# Patient Record
Sex: Female | Born: 1985 | Hispanic: No | Marital: Married | Smoking: Never smoker
Health system: Southern US, Community
[De-identification: ages and names within clinical notes are randomized; demographics above are authoritative.]

## PROBLEM LIST (undated history)

## (undated) ENCOUNTER — Inpatient Hospital Stay (HOSPITAL_COMMUNITY): Payer: Self-pay

## (undated) DIAGNOSIS — Z800 Family history of malignant neoplasm of digestive organs: Secondary | ICD-10-CM

## (undated) DIAGNOSIS — Z8042 Family history of malignant neoplasm of prostate: Secondary | ICD-10-CM

## (undated) DIAGNOSIS — Z789 Other specified health status: Secondary | ICD-10-CM

## (undated) DIAGNOSIS — Z803 Family history of malignant neoplasm of breast: Secondary | ICD-10-CM

## (undated) DIAGNOSIS — D649 Anemia, unspecified: Secondary | ICD-10-CM

## (undated) HISTORY — DX: Family history of malignant neoplasm of breast: Z80.3

## (undated) HISTORY — DX: Anemia, unspecified: D64.9

## (undated) HISTORY — DX: Family history of malignant neoplasm of digestive organs: Z80.0

## (undated) HISTORY — PX: WISDOM TOOTH EXTRACTION: SHX21

## (undated) HISTORY — DX: Family history of malignant neoplasm of prostate: Z80.42

## (undated) HISTORY — PX: WRIST SURGERY: SHX841

---
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
## 2009-08-13 ENCOUNTER — Inpatient Hospital Stay (HOSPITAL_COMMUNITY): Admission: AD | Admit: 2009-08-13 | Discharge: 2009-08-16 | Payer: Self-pay | Admitting: Obstetrics and Gynecology

## 2010-11-10 ENCOUNTER — Emergency Department (HOSPITAL_COMMUNITY)
Admission: EM | Admit: 2010-11-10 | Discharge: 2010-11-10 | Payer: Self-pay | Source: Home / Self Care | Admitting: Family Medicine

## 2011-02-15 LAB — CBC
HCT: 33.7 % — ABNORMAL LOW (ref 36.0–46.0)
MCV: 82.1 fL (ref 78.0–100.0)
MCV: 83.2 fL (ref 78.0–100.0)
Platelets: 247 10*3/uL (ref 150–400)
RBC: 3.58 MIL/uL — ABNORMAL LOW (ref 3.87–5.11)
RBC: 4.1 MIL/uL (ref 3.87–5.11)
RDW: 13.7 % (ref 11.5–15.5)
RDW: 13.7 % (ref 11.5–15.5)
WBC: 12.2 10*3/uL — ABNORMAL HIGH (ref 4.0–10.5)

## 2012-01-07 ENCOUNTER — Emergency Department (INDEPENDENT_AMBULATORY_CARE_PROVIDER_SITE_OTHER)
Admission: EM | Admit: 2012-01-07 | Discharge: 2012-01-07 | Disposition: A | Discharge status: Home Health | Payer: Self-pay | Source: Home / Self Care | Attending: Emergency Medicine | Admitting: Emergency Medicine

## 2012-01-07 ENCOUNTER — Encounter (HOSPITAL_COMMUNITY): Payer: Self-pay

## 2012-01-07 DIAGNOSIS — B86 Scabies: Secondary | ICD-10-CM

## 2012-01-07 MED ORDER — PERMETHRIN 5 % EX CREA: TOPICAL_CREAM | CUTANEOUS | Status: AC

## 2012-01-07 MED ORDER — PREDNISONE 50 MG PO TABS: ORAL_TABLET | ORAL | Status: AC

## 2012-01-07 MED ORDER — LORATADINE 10 MG PO TABS: 10.0000 mg | ORAL_TABLET | Freq: Every day | ORAL | Status: DC

## 2012-01-07 NOTE — ED Provider Notes (Signed)
History     CSN: [id number]  Arrival date & time 01/07/12  1432   First MD Initiated Contact with Patient 01/07/12 1501      Chief Complaint  Patient presents with  . Rash    (Consider location/radiation/quality/duration/timing/severity/associated sxs/prior treatment) HPI Comments: Pt with progressively worsening  itchy rash on her arms, underneath her bra, torso, growing starting 5 days ago. States itching is worse at night. 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  No new lotions, soaps, detergents, medications. No exposure to poison ivy. Taking Benadryl with some relief.   Patient is a 26 y.o. female presenting with rash. The history is provided by the patient. No language interpreter was used.  Rash  This is a new problem. The current episode started more than 2 days ago. The problem has been gradually worsening. There has been no fever. The rash is present on the abdomen, back, left hand, right hand and groin. Associated symptoms include itching. Pertinent negatives include no blisters, no pain and no weeping. She has tried antihistamines for the symptoms. The treatment provided mild relief.    History reviewed. No pertinent past medical history.  Past Surgical History  Procedure Date  . Wrist surgery     History reviewed. No pertinent family history.  History  Substance Use Topics  . Smoking status: Never Smoker   . Smokeless tobacco: Not on file  . Alcohol Use: Yes    OB History    Grav Para Term Preterm Abortions TAB SAB Ect Mult Living                  Review of Systems  Constitutional: Negative for fever.  HENT: Negative.   Respiratory: Negative for shortness of breath.   Gastrointestinal: Negative for nausea, vomiting and abdominal pain.  Skin: Positive for itching and rash.    Allergies  Review of patient's allergies indicates no known allergies.  Home Medications   Current Outpatient Rx  Name Route Sig Dispense  Refill  . LORATADINE 10 MG PO TABS Oral Take 1 tablet (10 mg total) by mouth daily. 10 tablet 0  . PERMETHRIN 5 % EX CREA  Apply from chin down, leave on for 8-14 hours, rinse. Repeat in 1 week 60 g 0  . PREDNISONE 50 MG PO TABS  1 tablet po daily x 2 days, then 1/2 tablet once daily for 2 days 5 tablet 0    BP 108/73  Pulse 86  Temp(Src) 98.2 F (36.8 C) (Oral)  Resp 14  SpO2 98%  LMP 12/19/2011  Physical Exam  Nursing note and vitals reviewed. Constitutional: She is oriented to person, place, and time. She appears well-developed and well-nourished. No distress.  HENT:  Head: Normocephalic and atraumatic.  Eyes: Conjunctivae and EOM are normal.  Neck: Normal range of motion.  Cardiovascular: Normal rate.   Pulmonary/Chest: Effort normal.  Abdominal: She exhibits no distension.  Musculoskeletal: Normal range of motion.  Neurological: She is alert and oriented to person, place, and time.  Skin: Skin is warm and dry. Rash noted.       Scattered papular rash over torso, in between fingers, and in groin.  Psychiatric: She has a normal mood and affect. Her behavior is normal. Judgment and thought content normal.    ED Course  Procedures (including critical care time)  Labs Reviewed - No data to display No results found.   1. Scabies       MDM  Patient  also has extremely dry skin, however, has multiple contacts with scabies.   Luiz Blare, MD 01/07/12 [phone]

## 2012-01-07 NOTE — Discharge Instructions (Signed)
Curel, aveeno, neutrogena make good, heavy moisturizing creams. Take the medication as written. Return if you get worse or for any other concerns.   Scabies Scabies are small bugs (mites) that burrow under the skin and cause red bumps and severe itching. These bugs can only be seen with a microscope. Scabies are highly contagious. They can spread easily from person to person by direct contact. They are also spread through sharing clothing or linens that have the scabies mites living in them. It is not unusual for an entire family to become infected through shared towels, clothing, or bedding.  HOME CARE INSTRUCTIONS   Your caregiver may prescribe a cream or lotion to kill the mites. If this cream is prescribed; massage the cream into the entire area of the body from the neck to the bottom of both feet. Also massage the cream into the scalp and face if your child is less than 92 year old. Avoid the eyes and mouth.   Leave the cream on for 8 to12 hours. Do not wash your hands after application. Your child should bathe or shower after the 8 to 12 hour application period. Sometimes it is helpful to apply the cream to your child at right before bedtime.   One treatment is usually effective and will eliminate approximately 95% of infestations. For severe cases, your caregiver may decide to repeat the treatment in 1 week. Everyone in your household should be treated with one application of the cream.   New rashes or burrows should not appear after successful treatment within 24 to 48 hours; however the itching and rash may last for 2 to 4 weeks after successful treatment. If your symptoms persist longer than this, see your caregiver.   Your caregiver also may prescribe a medication to help with the itching or to help the rash go away more quickly.   Scabies can live on clothing or linens for up to 3 days. Your entire child's recently used clothing, towels, stuffed toys, and bed linens should be washed in hot  water and then dried in a dryer for at least 20 minutes on high heat. Items that cannot be washed should be enclosed in a plastic bag for at least 3 days.   To help relieve itching, bathe your child in a cool bath or apply cool washcloths to the affected areas.   Your child may return to school after treatment with the prescribed cream.  SEEK MEDICAL CARE IF:   The itching persists longer than 4 weeks after treatment.   The rash spreads or becomes infected (the area has red blisters or yellow-tan crust).  Document Released: 10/29/2005 Document Revised: 07/11/2011 Document Reviewed: 03/09/2009 [facility] Patient Information 2012 [place], [place].

## 2012-01-07 NOTE — ED Notes (Signed)
C/o rash with itching for 5 days.  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

## 2012-06-27 ENCOUNTER — Inpatient Hospital Stay (HOSPITAL_COMMUNITY)
Admission: AD | Admit: 2012-06-27 | Discharge: 2012-06-27 | Disposition: A | Discharge status: Home / Self Care | Payer: Medicaid Other | Source: Ambulatory Visit | Attending: Obstetrics and Gynecology | Admitting: Obstetrics and Gynecology

## 2012-06-27 ENCOUNTER — Inpatient Hospital Stay (HOSPITAL_COMMUNITY): Payer: Medicaid Other

## 2012-06-27 ENCOUNTER — Encounter (HOSPITAL_COMMUNITY): Payer: Self-pay | Admitting: *Deleted

## 2012-06-27 DIAGNOSIS — O209 Hemorrhage in early pregnancy, unspecified: Secondary | ICD-10-CM | POA: Insufficient documentation

## 2012-06-27 DIAGNOSIS — O26899 Other specified pregnancy related conditions, unspecified trimester: Secondary | ICD-10-CM

## 2012-06-27 DIAGNOSIS — R109 Unspecified abdominal pain: Secondary | ICD-10-CM | POA: Insufficient documentation

## 2012-06-27 HISTORY — DX: Other specified health status: Z78.9

## 2012-06-27 LAB — WET PREP, GENITAL: Yeast Wet Prep HPF POC: NONE SEEN

## 2012-06-27 LAB — URINALYSIS, ROUTINE W REFLEX MICROSCOPIC
Glucose, UA: NEGATIVE mg/dL
Protein, ur: NEGATIVE mg/dL
Specific Gravity, Urine: 1.01 (ref 1.005–1.030)
Urobilinogen, UA: 0.2 mg/dL (ref 0.0–1.0)
pH: 6 (ref 5.0–8.0)

## 2012-06-27 LAB — HCG, QUANTITATIVE, PREGNANCY: hCG, Beta Chain, Quant, S: 848 m[IU]/mL — ABNORMAL HIGH (ref <5)

## 2012-06-27 LAB — CBC
MCHC: 33.9 g/dL (ref 30.0–36.0)
MCV: 83 fL (ref 78.0–100.0)
Platelets: 257 10*3/uL (ref 150–400)
RBC: 4.3 MIL/uL (ref 3.87–5.11)
WBC: 8.2 10*3/uL (ref 4.0–10.5)

## 2012-06-27 LAB — POCT PREGNANCY, URINE: Preg Test, Ur: POSITIVE — AB

## 2012-06-27 NOTE — MAU Provider Note (Signed)
History   Sharon RACZKOWSKI is a 26 y.o. G2P1001 female at approximately 5wks 4days by uncertain LNMP of 05/19/12  presenting with report of small amount darker red intermittent vaginal bleeding that began last night.  Also intermittent 'contraction' pain in lower abdomen that began about a week ago, and lower back achiness x 2 weeks. Hasn't seen provider yet during the pregnancy.  +HPT x 3 this past Sunday, went to [facility] on Tues to confirm.  Last sexual intercourse this past Sunday.   CSN: [id number]  Arrival date and time: 06/27/12 1944   None     Chief Complaint  Patient presents with  . Back Pain  . Vaginal Bleeding  . Abdominal Pain   HPI  OB History    Grav Para Term Preterm Abortions TAB SAB Ect Mult Living   2 1 1       1       Past Medical History  Diagnosis Date  . No pertinent past medical history     Past Surgical History  Procedure Date  . Wrist surgery     Family History  Problem Relation Age of Onset  . Heart disease Paternal Aunt   . Heart disease Paternal Uncle   . Diabetes Maternal Grandmother   . Hypertension Maternal Grandmother   . Diabetes Maternal Grandfather   . Heart disease Maternal Grandfather   . Hypertension Maternal Grandfather   . Diabetes Paternal Grandmother   . Cancer Paternal Grandmother   . Diabetes Paternal Grandfather     History  Substance Use Topics  . Smoking status: Never Smoker   . Smokeless tobacco: Not on file  . Alcohol Use: Yes     rarely    Allergies: No Known Allergies  Prescriptions prior to admission  Medication Sig Dispense Refill  . Prenatal Vit-Fe Fumarate-FA (MULTIVITAMIN-PRENATAL) 27-0.8 MG TABS Take 1 tablet by mouth daily.        Review of Systems  Constitutional: Negative.  Negative for fever and chills.  Eyes: Negative.   Respiratory: Negative.   Cardiovascular: Negative.   Gastrointestinal: Positive for nausea and abdominal pain (lower abdominal 'contraction' type pain x 1 week). Negative for  vomiting, diarrhea and constipation.  Genitourinary: Negative.   Musculoskeletal: Positive for back pain (lower back achiness x 2 weeks).  Skin: Negative.   Neurological: Positive for headaches.  Endo/Heme/Allergies: Negative.   Psychiatric/Behavioral: Negative.    Physical Exam   Blood pressure 116/69, pulse 86, temperature 99.1 F (37.3 C), temperature source Oral, resp. rate 18, height 5\' 6"  (1.676 m), weight 85.9 kg (189 lb 6 oz), last menstrual period 05/19/2012.  Physical Exam  Constitutional: She is oriented to person, place, and time. She appears well-developed and well-nourished.  HENT:  Head: Normocephalic.  Eyes: Pupils are equal, round, and reactive to light.  Neck: Normal range of motion.  Cardiovascular: Normal rate and regular rhythm.   Respiratory: Effort normal and breath sounds normal.  GI: Soft. She exhibits no distension.  Genitourinary:       Mons piercing Spec exam: cervix visually closed, small amount white non-odorous mucousy d/c, no active bleeding or old blood in vault   Musculoskeletal: Normal range of motion.  Neurological: She is alert and oriented to person, place, and time. She has normal reflexes.  Skin: Skin is warm and dry.  Psychiatric: She has a normal mood and affect. Her behavior is normal. Judgment and thought content normal.    MAU Course  Procedures  MDM: CBC, ABO Rh,  Quant HCG <14wk comp ob u/s  Spec exam w/ GC/CT and wet prep obtained  Results for orders placed during the hospital encounter of 06/27/12 (from the past 24 hour(s))  URINALYSIS, ROUTINE W REFLEX MICROSCOPIC     Status: Normal   Collection Time   06/27/12  8:03 PM      Component Value Range   Color, Urine YELLOW  YELLOW   APPearance CLEAR  CLEAR   Specific Gravity, Urine 1.010  1.005 - 1.030   pH 6.0  5.0 - 8.0   Glucose, UA NEGATIVE  NEGATIVE mg/dL   Hgb urine dipstick NEGATIVE  NEGATIVE   Bilirubin Urine NEGATIVE  NEGATIVE   Ketones, ur NEGATIVE  NEGATIVE  mg/dL   Protein, ur NEGATIVE  NEGATIVE mg/dL   Urobilinogen, UA 0.2  0.0 - 1.0 mg/dL   Nitrite NEGATIVE  NEGATIVE   Leukocytes, UA NEGATIVE  NEGATIVE  POCT PREGNANCY, URINE     Status: Abnormal   Collection Time   06/27/12  8:27 PM      Component Value Range   Preg Test, Ur POSITIVE (*) NEGATIVE  CBC     Status: Abnormal   Collection Time   06/27/12  9:00 PM      Component Value Range   WBC 8.2  4.0 - 10.5 K/uL   RBC 4.30  3.87 - 5.11 MIL/uL   Hemoglobin 12.1  12.0 - 15.0 g/dL   HCT 40.9 (*) 81.1 - 91.4 %   MCV 83.0  78.0 - 100.0 fL   MCH 28.1  26.0 - 34.0 pg   MCHC 33.9  30.0 - 36.0 g/dL   RDW 78.2  95.6 - 21.3 %   Platelets 257  150 - 400 K/uL  ABO/RH     Status: Normal (Preliminary result)   Collection Time   06/27/12  9:00 PM      Component Value Range   ABO/RH(D) O POS    HCG, QUANTITATIVE, PREGNANCY     Status: Abnormal   Collection Time   06/27/12  9:00 PM      Component Value Range   hCG, Beta Chain, Quant, S 848 (*) <5 mIU/mL  WET PREP, GENITAL     Status: Abnormal   Collection Time   06/27/12  9:14 PM      Component Value Range   Yeast Wet Prep HPF POC NONE SEEN  NONE SEEN   Trich, Wet Prep NONE SEEN  NONE SEEN   Clue Cells Wet Prep HPF POC FEW (*) NONE SEEN   WBC, Wet Prep HPF POC FEW (*) NONE SEEN     Mar[place], Span  Home Medication Instructions [id number]   Printed on:06/27/12 2235  Medication Information                    Prenatal Vit-Fe Fumarate-FA (MULTIVITAMIN-PRENATAL) 27-0.8 MG TABS Take 1 tablet by mouth daily.            Follow-up Information    Follow up with [facility]. (ultrasound will call you with an appointment.  Return before then  if symptoms worsen)    Contact information:   [address] [place] [address] [phone]         Assessment and Plan  A:  5wks 4days gestation by today's u/s- IU gestational sac  Vaginal bleeding   Abdominal pain  P:  D/C home  Pelvic rest  Return for f/u u/s as  recommended by radiologist in 10-14d, u/s  to call pt for appt  Initiate prenatal care at preferred provider  Marge Duncans, CNM 06/27/2012, 8:57 PM

## 2012-06-27 NOTE — MAU Note (Signed)
Pt c/o achy pressure in her low back that has worsened over the past week. Denies any urinary symptoms.

## 2012-06-27 NOTE — MAU Note (Signed)
Pt states, " I started having bleeding off and on last night with low abdominal cramping. I've had burning pain in low back with aching for one week."

## 2012-06-28 LAB — GC/CHLAMYDIA PROBE AMP, GENITAL
Chlamydia, DNA Probe: NEGATIVE
GC Probe Amp, Genital: NEGATIVE

## 2012-06-28 LAB — ABO/RH: ABO/RH(D): O POS

## 2012-06-28 NOTE — MAU Provider Note (Signed)
Attestation of Attending Supervision of Advanced Practitioner: Evaluation and management procedures were performed by the PA/NP/CNM/OB Fellow under my supervision/collaboration. Chart reviewed and agree with management and plan.  Bilbo Carcamo V 06/28/2012 2:20 AM

## 2012-07-10 ENCOUNTER — Ambulatory Visit (HOSPITAL_COMMUNITY): Admission: RE | Admit: 2012-07-10 | Payer: Medicaid Other | Source: Ambulatory Visit

## 2012-07-21 ENCOUNTER — Ambulatory Visit (HOSPITAL_COMMUNITY)
Admission: RE | Admit: 2012-07-21 | Discharge: 2012-07-21 | Disposition: A | Discharge status: Home / Self Care | Payer: Medicaid Other | Source: Ambulatory Visit | Attending: Women's Health | Admitting: Women's Health

## 2012-07-21 DIAGNOSIS — O3680X0 Pregnancy with inconclusive fetal viability, not applicable or unspecified: Secondary | ICD-10-CM | POA: Insufficient documentation

## 2012-07-21 DIAGNOSIS — R109 Unspecified abdominal pain: Secondary | ICD-10-CM

## 2012-07-21 DIAGNOSIS — O26899 Other specified pregnancy related conditions, unspecified trimester: Secondary | ICD-10-CM

## 2012-07-21 DIAGNOSIS — Z3689 Encounter for other specified antenatal screening: Secondary | ICD-10-CM | POA: Insufficient documentation

## 2012-07-21 DIAGNOSIS — O209 Hemorrhage in early pregnancy, unspecified: Secondary | ICD-10-CM

## 2012-11-04 ENCOUNTER — Inpatient Hospital Stay (HOSPITAL_COMMUNITY)
Admission: AD | Admit: 2012-11-04 | Discharge: 2012-11-05 | Disposition: A | Discharge status: Home / Self Care | Payer: Medicaid Other | Source: Ambulatory Visit | Attending: Obstetrics & Gynecology | Admitting: Obstetrics & Gynecology

## 2012-11-04 ENCOUNTER — Encounter (HOSPITAL_COMMUNITY): Payer: Self-pay | Admitting: *Deleted

## 2012-11-04 DIAGNOSIS — R1011 Right upper quadrant pain: Secondary | ICD-10-CM | POA: Insufficient documentation

## 2012-11-04 DIAGNOSIS — K219 Gastro-esophageal reflux disease without esophagitis: Secondary | ICD-10-CM | POA: Insufficient documentation

## 2012-11-04 DIAGNOSIS — R1013 Epigastric pain: Secondary | ICD-10-CM

## 2012-11-04 DIAGNOSIS — K2970 Gastritis, unspecified, without bleeding: Secondary | ICD-10-CM | POA: Insufficient documentation

## 2012-11-04 DIAGNOSIS — R52 Pain, unspecified: Secondary | ICD-10-CM

## 2012-11-04 DIAGNOSIS — O99891 Other specified diseases and conditions complicating pregnancy: Secondary | ICD-10-CM | POA: Insufficient documentation

## 2012-11-04 LAB — CBC WITH DIFFERENTIAL/PLATELET
Basophils Relative: 0 % (ref 0–1)
Eosinophils Absolute: 0.1 10*3/uL (ref 0.0–0.7)
HCT: 34.3 % — ABNORMAL LOW (ref 36.0–46.0)
Hemoglobin: 11.6 g/dL — ABNORMAL LOW (ref 12.0–15.0)
MCH: 28.1 pg (ref 26.0–34.0)
MCHC: 33.8 g/dL (ref 30.0–36.0)
MCV: 83.1 fL (ref 78.0–100.0)
RBC: 4.13 MIL/uL (ref 3.87–5.11)
WBC: 8.4 10*3/uL (ref 4.0–10.5)

## 2012-11-04 LAB — URINALYSIS, ROUTINE W REFLEX MICROSCOPIC
Ketones, ur: >80 mg/dL — AB
Nitrite: NEGATIVE
Specific Gravity, Urine: 1.015 (ref 1.005–1.030)
Urobilinogen, UA: 0.2 mg/dL (ref 0.0–1.0)

## 2012-11-04 LAB — COMPREHENSIVE METABOLIC PANEL
Alkaline Phosphatase: 52 U/L (ref 39–117)
CO2: 22 mEq/L (ref 19–32)
Calcium: 8.9 mg/dL (ref 8.4–10.5)
Chloride: 101 mEq/L (ref 96–112)
Creatinine, Ser: 0.56 mg/dL (ref 0.50–1.10)
GFR calc non Af Amer: >90 mL/min (ref >90)
Glucose, Bld: 83 mg/dL (ref 70–99)
Potassium: 3.7 mEq/L (ref 3.5–5.1)
Total Bilirubin: 0.4 mg/dL (ref 0.3–1.2)

## 2012-11-04 LAB — URINE MICROSCOPIC-ADD ON

## 2012-11-04 MED ORDER — FAMOTIDINE 20 MG PO TABS: 20.0000 mg | ORAL_TABLET | Freq: Two times a day (BID) | ORAL | Status: DC | PRN

## 2012-11-04 MED ORDER — FAMOTIDINE 20 MG PO TABS
20.0000 mg | ORAL_TABLET | Freq: Once | ORAL | Status: AC
  Administered 2012-11-04: 20 mg via ORAL
  Filled 2012-11-04: qty 1

## 2012-11-04 MED ORDER — GI COCKTAIL ~~LOC~~
30.0000 mL | Freq: Once | ORAL | Status: AC
  Administered 2012-11-04: 30 mL via ORAL
  Filled 2012-11-04: qty 30

## 2012-11-04 NOTE — Progress Notes (Signed)
Pt states she is on bed rest for placenta acreta

## 2012-11-04 NOTE — MAU Provider Note (Signed)
History     CSN: [id number]  Arrival date and time: 11/04/12 2050   None     Chief Complaint  Patient presents with  . Abdominal Pain   HPI 26 y.o. G2P1001 at [redacted]w[redacted]d with upper abdominal pain that started 10 hours ago. Sharp pain, comes and goes. Lasts 20-30 seconds. Radiates down to umbilicus and to left shoulder.  Mild nausea, emesis x 1. No diarrhea. Had diarrhea this morning. No fever/chills. No bleeding, LOF. Baby moving well.   Sees Dr Excell Seltzer at [facility]. Last visit a week ago. Next visit 1/16. No complications with pregnancy or with last.  OB History    Grav Para Term Preterm Abortions TAB SAB Ect Mult Living   2 1 1       1       Past Medical History  Diagnosis Date  . No pertinent past medical history     Past Surgical History  Procedure Date  . Wrist surgery     Family History  Problem Relation Age of Onset  . Heart disease Paternal Aunt   . Heart disease Paternal Uncle   . Diabetes Maternal Grandmother   . Hypertension Maternal Grandmother   . Diabetes Maternal Grandfather   . Heart disease Maternal Grandfather   . Hypertension Maternal Grandfather   . Diabetes Paternal Grandmother   . Cancer Paternal Grandmother     History  Substance Use Topics  . Smoking status: Never Smoker   . Smokeless tobacco: Not on file  . Alcohol Use: Yes     Comment: rarely    Allergies: No Known Allergies  Prescriptions prior to admission  Medication Sig Dispense Refill  . Prenatal Vit-Fe Fumarate-FA (MULTIVITAMIN-PRENATAL) 27-0.8 MG TABS Take 1 tablet by mouth daily.        Review of Systems  Constitutional: Negative for fever and chills.  Eyes: Negative for blurred vision and double vision.  Gastrointestinal: Positive for nausea, vomiting, abdominal pain and diarrhea. Negative for heartburn and constipation.  Genitourinary: Negative for dysuria and urgency.  Musculoskeletal: Negative for back pain.  Neurological: Negative for dizziness and  headaches.   Physical Exam   Blood pressure 118/67, pulse 108, temperature 98.2 F (36.8 C), temperature source Oral, resp. rate 18, last menstrual period 05/19/2012.  Physical Exam  Constitutional: She is oriented to person, place, and time. She appears well-developed and well-nourished. No distress.  HENT:  Head: Normocephalic and atraumatic.  Eyes: Conjunctivae normal and EOM are normal.  Neck: Normal range of motion. Neck supple.  Cardiovascular: Normal rate, regular rhythm and normal heart sounds.   Respiratory: Effort normal and breath sounds normal. No respiratory distress.  GI: Soft. Bowel sounds are normal. There is Tenderness: RUQ and epigastric area, mild tenderness.. There is no rebound and no guarding.  Musculoskeletal: Normal range of motion. She exhibits no edema and no tenderness.  Neurological: She is alert and oriented to person, place, and time.  Skin: Skin is warm and dry.  Psychiatric: She has a normal mood and affect.   Results for orders placed during the hospital encounter of 11/04/12 (from the past 24 hour(s))  URINALYSIS, ROUTINE W REFLEX MICROSCOPIC     Status: Abnormal   Collection Time   11/04/12 10:30 PM      Component Value Range   Color, Urine YELLOW  YELLOW   APPearance CLEAR  CLEAR   Specific Gravity, Urine 1.015  1.005 - 1.030   pH 6.0  5.0 - 8.0   Glucose, UA NEGATIVE  NEGATIVE mg/dL   Hgb urine dipstick NEGATIVE  NEGATIVE   Bilirubin Urine NEGATIVE  NEGATIVE   Ketones, ur >80 (*) NEGATIVE mg/dL   Protein, ur NEGATIVE  NEGATIVE mg/dL   Urobilinogen, UA 0.2  0.0 - 1.0 mg/dL   Nitrite NEGATIVE  NEGATIVE   Leukocytes, UA SMALL (*) NEGATIVE  URINE MICROSCOPIC-ADD ON     Status: Normal   Collection Time   11/04/12 10:30 PM      Component Value Range   Squamous Epithelial / LPF RARE  RARE   WBC, UA 0-2  <3 WBC/hpf   RBC / HPF 0-2  <3 RBC/hpf  CBC WITH DIFFERENTIAL     Status: Abnormal   Collection Time   11/04/12 10:38 PM      Component  Value Range   WBC 8.4  4.0 - 10.5 K/uL   RBC 4.13  3.87 - 5.11 MIL/uL   Hemoglobin 11.6 (*) 12.0 - 15.0 g/dL   HCT 47.8 (*) 29.5 - 62.1 %   MCV 83.1  78.0 - 100.0 fL   MCH 28.1  26.0 - 34.0 pg   MCHC 33.8  30.0 - 36.0 g/dL   RDW 30.8  65.7 - 84.6 %   Platelets 195  150 - 400 K/uL   Neutrophils Relative 90 (*) 43 - 77 %   Neutro Abs 7.6  1.7 - 7.7 K/uL   Lymphocytes Relative 7 (*) 12 - 46 %   Lymphs Abs 0.6 (*) 0.7 - 4.0 K/uL   Monocytes Relative 3  3 - 12 %   Monocytes Absolute 0.2  0.1 - 1.0 K/uL   Eosinophils Relative 1  0 - 5 %   Eosinophils Absolute 0.1  0.0 - 0.7 K/uL   Basophils Relative 0  0 - 1 %   Basophils Absolute 0.0  0.0 - 0.1 K/uL  COMPREHENSIVE METABOLIC PANEL     Status: Abnormal   Collection Time   11/04/12 10:38 PM      Component Value Range   Sodium 134 (*) 135 - 145 mEq/L   Potassium 3.7  3.5 - 5.1 mEq/L   Chloride 101  96 - 112 mEq/L   CO2 22  19 - 32 mEq/L   Glucose, Bld 83  70 - 99 mg/dL   BUN 5 (*) 6 - 23 mg/dL   Creatinine, Ser 9.62  0.50 - 1.10 mg/dL   Calcium 8.9  8.4 - 95.2 mg/dL   Total Protein 6.4  6.0 - 8.3 g/dL   Albumin 2.9 (*) 3.5 - 5.2 g/dL   AST 18  0 - 37 U/L   ALT 10  0 - 35 U/L   Alkaline Phosphatase 52  39 - 117 U/L   Total Bilirubin 0.4  0.3 - 1.2 mg/dL   GFR calc non Af Amer >90  >90 mL/min   GFR calc Af Amer >90  >90 mL/min     FHTs:  155, moderate variability, accels present, no decels. TOCO:  No contractions   MAU Course  Procedures  GI Cocktail relieved pain - 10/10 --> 4/10. Also gave one dose pepcid  Assessment and Plan  26 y.o. G2P1001 at [redacted]w[redacted]d with epigastric/right upper quadrant pain.  - gastritis/GERD vs gallbladder vs colitis - Hemodynamically stable, FHTs reassuring, LFTs and CBC within normal limits - Home with pepcid, f/u with OB.  Napoleon Form 11/04/2012, 9:57 PM

## 2012-11-04 NOTE — Progress Notes (Signed)
Pt states she had a episode of vomiting and diarrhea x1   This morning

## 2012-11-04 NOTE — MAU Note (Signed)
Pt states she started  Having pain about 1200. Pt states she feels the pain more when she walks

## 2012-11-05 LAB — LIPASE, BLOOD: Lipase: 14 U/L (ref 11–59)

## 2012-11-06 NOTE — MAU Provider Note (Signed)
Attestation of Attending Supervision of Obstetric Fellow: Evaluation and management procedures were performed by the Obstetric Fellow under my supervision and collaboration.  I have reviewed the Obstetric Fellow's note and chart, and I agree with the management and plan.  Kumari Sculley, MD, FACOG Attending Obstetrician & Gynecologist Faculty Practice, [facility]   

## 2014-09-13 ENCOUNTER — Encounter (HOSPITAL_COMMUNITY): Payer: Self-pay | Admitting: *Deleted

## 2016-12-07 ENCOUNTER — Encounter (HOSPITAL_COMMUNITY): Payer: Self-pay | Admitting: Emergency Medicine

## 2016-12-07 ENCOUNTER — Ambulatory Visit (HOSPITAL_COMMUNITY)
Admission: EM | Admit: 2016-12-07 | Discharge: 2016-12-07 | Disposition: A | Discharge status: Home / Self Care | Payer: Medicaid Other | Attending: Family Medicine | Admitting: Family Medicine

## 2016-12-07 DIAGNOSIS — K0889 Other specified disorders of teeth and supporting structures: Secondary | ICD-10-CM | POA: Diagnosis not present

## 2016-12-07 DIAGNOSIS — J029 Acute pharyngitis, unspecified: Secondary | ICD-10-CM | POA: Insufficient documentation

## 2016-12-07 LAB — POCT INFECTIOUS MONO SCREEN: MONO SCREEN: NEGATIVE

## 2016-12-07 LAB — POCT RAPID STREP A: Streptococcus, Group A Screen (Direct): NEGATIVE

## 2016-12-07 MED ORDER — DICLOFENAC SODIUM 75 MG PO TBEC: 75.0000 mg | DELAYED_RELEASE_TABLET | Freq: Two times a day (BID) | ORAL | 0 refills | Status: DC

## 2016-12-07 MED ORDER — AMOXICILLIN 500 MG PO CAPS: 500.0000 mg | ORAL_CAPSULE | Freq: Three times a day (TID) | ORAL | 0 refills | Status: DC

## 2016-12-07 NOTE — Discharge Instructions (Signed)
You have been tested for Group A strep and for infectious mononucleosis. Both were negative. I have started you on an antibiotic for possible dental abscess and diclofenac for pain. Take Amoxacillin 3 times daily and the diclofenac twice daily. Should your symptoms fail to improve recommend following up with a dentist for further evaluation

## 2016-12-07 NOTE — ED Provider Notes (Signed)
CSN: [id number]     Arrival date & time 12/07/16  1012 History   First MD Initiated Contact with Patient 12/07/16 1107     Chief Complaint  Patient presents with  . Sore Throat   (Consider location/radiation/quality/duration/timing/severity/associated sxs/prior Treatment) 31 year old female presents with chief complaint of sore throat and swelling along the right side of her jaw. Swelling and pain started last night, it is painful to chew and painful to swallow. She reports URI like symptoms 1-2 weeks ago, reports those symptoms have cleared and she was feeling well prior to this.   The history is provided by the patient.  Sore Throat     Past Medical History:  Diagnosis Date  . No pertinent past medical history    Past Surgical History:  Procedure Laterality Date  . WRIST SURGERY     Family History  Problem Relation Age of Onset  . Heart disease Paternal Aunt   . Heart disease Paternal Uncle   . Diabetes Maternal Grandmother   . Hypertension Maternal Grandmother   . Diabetes Maternal Grandfather   . Heart disease Maternal Grandfather   . Hypertension Maternal Grandfather   . Diabetes Paternal Grandmother   . Cancer Paternal Grandmother    Social History  Substance Use Topics  . Smoking status: Never Smoker  . Smokeless tobacco: Never Used  . Alcohol use Yes     Comment: rarely   OB History    Gravida Para Term Preterm AB Living   2 1 1     1    SAB TAB Ectopic Multiple Live Births                 Review of Systems  Reason unable to perform ROS: as covered in HPI.  All other systems reviewed and are negative.   Allergies  Patient has no known allergies.  Home Medications   Prior to Admission medications   Medication Sig Start Date End Date Taking? Authorizing Provider  amoxicillin (AMOXIL) 500 MG capsule Take 1 capsule (500 mg total) by mouth 3 (three) times daily. 12/07/16   Barnet Glasgow, NP  diclofenac (VOLTAREN) 75 MG EC tablet Take 1 tablet (75 mg  total) by mouth 2 (two) times daily. 12/07/16   Barnet Glasgow, NP  famotidine (PEPCID) 20 MG tablet Take 1 tablet (20 mg total) by mouth 2 (two) times daily as needed for heartburn. 11/04/12   Martha Clan, MD  Prenatal Vit-Fe Fumarate-FA (MULTIVITAMIN-PRENATAL) 27-0.8 MG TABS Take 1 tablet by mouth daily.    Historical Provider, MD   Meds Ordered and Administered this Visit  Medications - No data to display  BP 117/83 (BP Location: Left Arm)   Pulse 111   Temp 98.5 F (36.9 C) (Oral)   Resp 18   LMP 12/07/2016   SpO2 100%   Breastfeeding? No  No data found.   Physical Exam  Constitutional: She is oriented to person, place, and time. She appears well-developed and well-nourished. No distress.  HENT:  Head: Normocephalic and atraumatic.    Right Ear: Tympanic membrane and external ear normal.  Left Ear: Tympanic membrane and external ear normal.  Nose: Nose normal.  Mouth/Throat: Uvula is midline and oropharynx is clear and moist. No oral lesions. Normal dentition. No dental abscesses or dental caries.  Neck: Normal range of motion. Neck supple. No JVD present.  Cardiovascular: Normal rate and regular rhythm.   Pulmonary/Chest: Effort normal.  Lymphadenopathy:       Head (right side):  Submandibular adenopathy present. No submental, no tonsillar and no preauricular adenopathy present.       Head (left side): No submental, no submandibular, no tonsillar and no preauricular adenopathy present.    She has no cervical adenopathy.  Neurological: She is alert and oriented to person, place, and time.  Skin: Skin is warm and dry. Capillary refill takes less than 2 seconds. She is not diaphoretic.  Psychiatric: She has a normal mood and affect.  Nursing note and vitals reviewed.   Urgent Care Course     Procedures (including critical care time)  Labs Review Labs Reviewed  CULTURE, GROUP A STREP [facility])  POCT RAPID STREP A  POCT INFECTIOUS MONO SCREEN    Imaging Review No  results found.   Visual Acuity Review  Right Eye Distance:   Left Eye Distance:   Bilateral Distance:    Right Eye Near:   Left Eye Near:    Bilateral Near:         MDM   1. Pain, dental     You have been tested for Group A strep and for infectious mononucleosis. Both were negative. I have started you on an antibiotic for possible dental abscess and diclofenac for pain. Take Amoxacillin 3 times daily and the diclofenac twice daily. Should your symptoms fail to improve recommend following up with a dentist for further evaluation      Barnet Glasgow, NP 12/07/16 1151

## 2016-12-07 NOTE — ED Triage Notes (Signed)
Here for ST onset last night associated w/submandibular swelling  Also states pain radiates to right ear   Denies fevers, chills  A&O x4... NAD

## 2016-12-09 LAB — CULTURE, GROUP A STREP (THRC)

## 2017-01-08 LAB — OB RESULTS CONSOLE GBS: STREP GROUP B AG: NEGATIVE

## 2017-04-15 ENCOUNTER — Emergency Department (HOSPITAL_COMMUNITY)
Admission: EM | Admit: 2017-04-15 | Discharge: 2017-04-15 | Disposition: A | Discharge status: Home / Self Care | Payer: Self-pay | Attending: Emergency Medicine | Admitting: Emergency Medicine

## 2017-04-15 ENCOUNTER — Encounter (HOSPITAL_COMMUNITY): Payer: Self-pay | Admitting: Emergency Medicine

## 2017-04-15 ENCOUNTER — Emergency Department (HOSPITAL_COMMUNITY): Payer: Self-pay

## 2017-04-15 DIAGNOSIS — Y929 Unspecified place or not applicable: Secondary | ICD-10-CM | POA: Insufficient documentation

## 2017-04-15 DIAGNOSIS — L03313 Cellulitis of chest wall: Secondary | ICD-10-CM | POA: Insufficient documentation

## 2017-04-15 DIAGNOSIS — S46911A Strain of unspecified muscle, fascia and tendon at shoulder and upper arm level, right arm, initial encounter: Secondary | ICD-10-CM | POA: Insufficient documentation

## 2017-04-15 DIAGNOSIS — Y939 Activity, unspecified: Secondary | ICD-10-CM | POA: Insufficient documentation

## 2017-04-15 DIAGNOSIS — X58XXXA Exposure to other specified factors, initial encounter: Secondary | ICD-10-CM | POA: Insufficient documentation

## 2017-04-15 DIAGNOSIS — Y999 Unspecified external cause status: Secondary | ICD-10-CM | POA: Insufficient documentation

## 2017-04-15 MED ORDER — OXYCODONE-ACETAMINOPHEN 5-325 MG PO TABS: 1.0000 | ORAL_TABLET | ORAL | 0 refills | Status: DC | PRN

## 2017-04-15 MED ORDER — MELOXICAM 15 MG PO TABS: 15.0000 mg | ORAL_TABLET | Freq: Every day | ORAL | 0 refills | Status: DC

## 2017-04-15 MED ORDER — IBUPROFEN 800 MG PO TABS
800.0000 mg | ORAL_TABLET | Freq: Once | ORAL | Status: AC
  Administered 2017-04-15: 800 mg via ORAL
  Filled 2017-04-15: qty 1

## 2017-04-15 MED ORDER — CEPHALEXIN 500 MG PO CAPS: 500.0000 mg | ORAL_CAPSULE | Freq: Four times a day (QID) | ORAL | 0 refills | Status: DC

## 2017-04-15 NOTE — ED Provider Notes (Signed)
[facility] DEPT Provider Note   CSN: [id number] Arrival date & time: 04/15/17  2002     History   Chief Complaint Chief Complaint  Patient presents with  . Shoulder Pain    HPI Sharon Thornton is a 31 y.o. female who presents emergency Department with chief complaint of left breast pain and right shoulder pain. Patient states that she was stung on the left breast by a bee earlier today. She states she has moderate tenderness to palpation and has noticed some spreading erythema from the site of the initial sting. She does not think that the stinger is still present. The patient also complains of pain in her right shoulder, which she rates as severe. The patient states that she went through a labor and delivery without an epidural twice and feels like this is much worse. She was out, weak appearing today with friends on the leg when she fell. She was dragged behind on the leg, holding onto the handlebars. She had some initial pain in the shoulder, however, she states that throughout the day it has become excessively worse and now severe. She has associated numbness and tingling in the right hand, especially in the distribution of the ulnar nerve. She denies neck pain, head trauma. She denies inability to move the arm. She has no previous injuries. HPI  Past Medical History:  Diagnosis Date  . No pertinent past medical history     There are no active problems to display for this patient.   Past Surgical History:  Procedure Laterality Date  . WRIST SURGERY      OB History    Gravida Para Term Preterm AB Living   2 1 1     1    SAB TAB Ectopic Multiple Live Births                   Home Medications    Prior to Admission medications   Medication Sig Start Date End Date Taking? Authorizing Provider  amoxicillin (AMOXIL) 500 MG capsule Take 1 capsule (500 mg total) by mouth 3 (three) times daily. 12/07/16   Barnet Glasgow, NP  diclofenac (VOLTAREN) 75 MG EC tablet Take 1 tablet  (75 mg total) by mouth 2 (two) times daily. 12/07/16   Barnet Glasgow, NP  famotidine (PEPCID) 20 MG tablet Take 1 tablet (20 mg total) by mouth 2 (two) times daily as needed for heartburn. 11/04/12   Martha Clan, MD  Prenatal Vit-Fe Fumarate-FA (MULTIVITAMIN-PRENATAL) 27-0.8 MG TABS Take 1 tablet by mouth daily.    [provider]    Family History Family History  Problem Relation Age of Onset  . Heart disease Paternal Aunt   . Heart disease Paternal Uncle   . Diabetes Maternal Grandmother   . Hypertension Maternal Grandmother   . Diabetes Maternal Grandfather   . Heart disease Maternal Grandfather   . Hypertension Maternal Grandfather   . Diabetes Paternal Grandmother   . Cancer Paternal Grandmother     Social History Social History  Substance Use Topics  . Smoking status: Never Smoker  . Smokeless tobacco: Never Used  . Alcohol use Yes     Comment: rarely     Allergies   Patient has no known allergies.   Review of Systems Review of Systems  Ten systems reviewed and are negative for acute change, except as noted in the HPI.   Physical Exam Updated Vital Signs BP (!) 110/93   Pulse 91   Temp 97.9 F (36.6  C)   Resp 18   Ht 5\' 7"  (1.702 m)   Wt 99.8 kg (220 lb)   LMP 03/31/2017   SpO2 100%   BMI 34.46 kg/m   Physical Exam  Constitutional: She is oriented to person, place, and time. She appears well-developed and well-nourished. No distress.  HENT:  Head: Normocephalic and atraumatic.  Eyes: Conjunctivae and EOM are normal. Pupils are equal, round, and reactive to light. No scleral icterus.  Neck: Normal range of motion.  Cardiovascular: Normal rate, regular rhythm and normal heart sounds.  Exam reveals no gallop and no friction rub.   No murmur heard. Pulmonary/Chest: Effort normal and breath sounds normal. No respiratory distress.  Left breast with a small area consistent with site of the initial sting from the bee. There is about 12-15 cm of  extending erythema, moderate tenderness. No step-off palpation, warmth present.  Abdominal: Soft. Bowel sounds are normal. She exhibits no distension and no mass. There is no tenderness. There is no guarding.  Musculoskeletal:  Right shoulder is not tender to palpation. She is able to abduct the arm slowly but complains of significant pain in the axilla. She is exquisitely tender to palpation there. She has normal bilateral grip strength. Range of motion is reduced secondary to pain.  Neurological: She is alert and oriented to person, place, and time.  Skin: Skin is warm and dry. She is not diaphoretic.  Psychiatric: Her behavior is normal.  Nursing note and vitals reviewed.    ED Treatments / Results  Labs (all labs ordered are listed, but only abnormal results are displayed) Labs Reviewed - No data to display  EKG  EKG Interpretation None       Radiology Dg Shoulder Right  Result Date: 04/15/2017 CLINICAL DATA:  Right shoulder pain-worse under arm onset today after wake boarding. Initial encounter. EXAM: RIGHT SHOULDER - 2+ VIEW COMPARISON:  None. FINDINGS: There is no evidence of fracture or dislocation. There is no evidence of arthropathy or other focal bone abnormality. Soft tissues are unremarkable. IMPRESSION: No acute osseous injury of the right shoulder. Electronically Signed   By: Kathreen Devoid   On: 04/15/2017 20:53    Procedures Procedures (including critical care time)  Medications Ordered in ED Medications - No data to display   Initial Impression / Assessment and Plan / ED Course  I have reviewed the triage vital signs and the nursing notes.  Pertinent labs & imaging results that were available during my care of the patient were reviewed by me and considered in my medical decision making (see chart for details).    patient with negative x-ray of the shoulder seen in shared visit with Dr. Lita Mains. Left breast appears consistent with developing cellulitis and  she'll be treated with Keflex. Her right shoulder injury is most consistent with a brachial plexus and/or a tear of the musculature on the chest wall in the serratus anterior. Given her neurologic symptoms, I feel this is most likely a brachial plexus injury. The patient feels improved in a sling with support of the arm. She will be given narcotic pain medication given the severity of her symptoms and follow up with orthopedics. Patient expresses understanding and agrees with plan of care. We have discussed return precautions. She appears safe for discharge at this time  Final Clinical Impressions(s) / ED Diagnoses   Final diagnoses:  None    New Prescriptions New Prescriptions   No medications on file     Margarita Mail, PA-C  04/16/17 1613    Julianne Rice, MD 04/18/17 1505

## 2017-04-15 NOTE — Discharge Instructions (Signed)
Contact a health care provider if: Your symptoms do not improve within 1 week. Your symptoms get worse. You have symptoms in both arms. Get help right away if: You develop severe neck pain. You lose control of your urine or stool. You develop new or increased weakness in your arms or legs.

## 2017-04-15 NOTE — ED Triage Notes (Signed)
Pt states she got stung by bee on her left breast earlier today and c/o right shoulder pain after wake boarding earlier. Left breast is swollen, red and warm to touch.

## 2017-06-20 LAB — OB RESULTS CONSOLE GC/CHLAMYDIA
CHLAMYDIA, DNA PROBE: NEGATIVE
GC PROBE AMP, GENITAL: NEGATIVE

## 2017-06-20 LAB — OB RESULTS CONSOLE ANTIBODY SCREEN: ANTIBODY SCREEN: NEGATIVE

## 2017-06-20 LAB — OB RESULTS CONSOLE HEPATITIS B SURFACE ANTIGEN: Hepatitis B Surface Ag: NEGATIVE

## 2017-06-20 LAB — OB RESULTS CONSOLE ABO/RH: RH Type: POSITIVE

## 2017-06-20 LAB — OB RESULTS CONSOLE HIV ANTIBODY (ROUTINE TESTING): HIV: NONREACTIVE

## 2017-06-20 LAB — OB RESULTS CONSOLE RPR: RPR: NONREACTIVE

## 2017-06-20 LAB — OB RESULTS CONSOLE RUBELLA ANTIBODY, IGM: RUBELLA: NON-IMMUNE/NOT IMMUNE

## 2017-11-12 NOTE — L&D Delivery Note (Signed)
Vaginal Delivery Note - shoulder dystocia  Patient pushed for less than 10 minutes after she was noted to be C/C/+2.  At 1:18 PM a viable and healthy female was delivered via .  Presentation: vertex; Position: Right,, Occiput,, Anterior; Station: +2. After the delivery of the head, the anterior shoulder did not immediately deliver with gentle downward traction and a shoulder dystocia was performed.  Simultaneous McRoberts and Suprapubic pressure was applied by the assisting nursing staff and the anterior shoulder delivered followed by the rest of the body easily.  The baby was placed on the maternal abdomen.  The baby had a vigorous cry and was moving all four extremities well. The cord was double clamped and cut. Cord blood obtained. The placenta spontaneously delivered intact 3 vessels noted.  Uterine atony was alleviated by massage and pitocin.  There were no noted lacerations.   Delivery of the head: 02/07/2018  1:18 PM First maneuver: 02/07/2018  1:18 PM, McRoberts Second maneuver: 02/07/2018  1:18 PM, Suprapubic Pressure   Verbal consent: obtained from patient.  APGAR: 9, 10; weight pending  Anesthesia:  Epidural Episiotomy: None Lacerations: None Suture Repair: n/a Est. Blood Loss (mL): 400  Mom to postpartum.  Baby to Couplet care / Skin to Skin.  Merril Isakson, [place] 02/07/2018, 1:43 PM

## 2017-11-13 LAB — OB RESULTS CONSOLE HIV ANTIBODY (ROUTINE TESTING): HIV: NONREACTIVE

## 2017-11-13 LAB — OB RESULTS CONSOLE RPR: RPR: NONREACTIVE

## 2018-01-08 LAB — OB RESULTS CONSOLE GBS: GBS: NEGATIVE

## 2018-01-31 ENCOUNTER — Telehealth (HOSPITAL_COMMUNITY): Payer: Self-pay | Admitting: *Deleted

## 2018-01-31 ENCOUNTER — Encounter (HOSPITAL_COMMUNITY): Payer: Self-pay | Admitting: *Deleted

## 2018-01-31 NOTE — Telephone Encounter (Signed)
Preadmission screen  

## 2018-02-06 ENCOUNTER — Other Ambulatory Visit: Payer: Self-pay | Admitting: Obstetrics & Gynecology

## 2018-02-07 ENCOUNTER — Inpatient Hospital Stay (HOSPITAL_COMMUNITY): Payer: Medicaid Other | Admitting: Anesthesiology

## 2018-02-07 ENCOUNTER — Inpatient Hospital Stay (HOSPITAL_COMMUNITY)
Admission: RE | Admit: 2018-02-07 | Discharge: 2018-02-08 | DRG: 807 | Disposition: A | Discharge status: Home / Self Care | Payer: Medicaid Other | Source: Ambulatory Visit | Attending: Obstetrics & Gynecology | Admitting: Obstetrics & Gynecology

## 2018-02-07 ENCOUNTER — Inpatient Hospital Stay (HOSPITAL_COMMUNITY)
Admission: AD | Admit: 2018-02-07 | Payer: Medicaid Other | Source: Ambulatory Visit | Admitting: Obstetrics and Gynecology

## 2018-02-07 ENCOUNTER — Encounter (HOSPITAL_COMMUNITY): Payer: Self-pay

## 2018-02-07 DIAGNOSIS — Z3A40 40 weeks gestation of pregnancy: Secondary | ICD-10-CM | POA: Diagnosis not present

## 2018-02-07 DIAGNOSIS — Z3483 Encounter for supervision of other normal pregnancy, third trimester: Secondary | ICD-10-CM | POA: Diagnosis present

## 2018-02-07 DIAGNOSIS — O99214 Obesity complicating childbirth: Secondary | ICD-10-CM | POA: Diagnosis present

## 2018-02-07 DIAGNOSIS — O99213 Obesity complicating pregnancy, third trimester: Secondary | ICD-10-CM | POA: Diagnosis present

## 2018-02-07 LAB — CBC
HCT: 33.4 % — ABNORMAL LOW (ref 36.0–46.0)
Hemoglobin: 10.9 g/dL — ABNORMAL LOW (ref 12.0–15.0)
MCH: 24.4 pg — AB (ref 26.0–34.0)
MCHC: 32.6 g/dL (ref 30.0–36.0)
MCV: 74.7 fL — AB (ref 78.0–100.0)
PLATELETS: 396 10*3/uL (ref 150–400)
RBC: 4.47 MIL/uL (ref 3.87–5.11)
RDW: 15.8 % — AB (ref 11.5–15.5)
WBC: 10.9 10*3/uL — ABNORMAL HIGH (ref 4.0–10.5)

## 2018-02-07 LAB — TYPE AND SCREEN
ABO/RH(D): O POS
Antibody Screen: NEGATIVE

## 2018-02-07 LAB — RPR: RPR Ser Ql: NONREACTIVE

## 2018-02-07 MED ORDER — METHYLERGONOVINE MALEATE 0.2 MG PO TABS: 0.2000 mg | ORAL_TABLET | ORAL | Status: DC | PRN

## 2018-02-07 MED ORDER — ACETAMINOPHEN 325 MG PO TABS
650.0000 mg | ORAL_TABLET | ORAL | Status: DC | PRN
  Administered 2018-02-08: 650 mg via ORAL
  Filled 2018-02-07: qty 2

## 2018-02-07 MED ORDER — SOD CITRATE-CITRIC ACID 500-334 MG/5ML PO SOLN: 30.0000 mL | ORAL | Status: DC | PRN

## 2018-02-07 MED ORDER — ONDANSETRON HCL 4 MG/2ML IJ SOLN: 4.0000 mg | Freq: Four times a day (QID) | INTRAMUSCULAR | Status: DC | PRN

## 2018-02-07 MED ORDER — ONDANSETRON HCL 4 MG PO TABS: 4.0000 mg | ORAL_TABLET | ORAL | Status: DC | PRN

## 2018-02-07 MED ORDER — SENNOSIDES-DOCUSATE SODIUM 8.6-50 MG PO TABS
2.0000 | ORAL_TABLET | ORAL | Status: DC
  Administered 2018-02-08: 2 via ORAL
  Filled 2018-02-07: qty 2

## 2018-02-07 MED ORDER — OXYTOCIN 40 UNITS IN LACTATED RINGERS INFUSION - SIMPLE MED: 2.5000 [IU]/h | INTRAVENOUS | Status: DC | PRN

## 2018-02-07 MED ORDER — OXYTOCIN BOLUS FROM INFUSION
500.0000 mL | Freq: Once | INTRAVENOUS | Status: AC
  Administered 2018-02-07: 500 mL via INTRAVENOUS

## 2018-02-07 MED ORDER — PRENATAL MULTIVITAMIN CH
1.0000 | ORAL_TABLET | Freq: Every day | ORAL | Status: DC
  Administered 2018-02-08: 1 via ORAL
  Filled 2018-02-07: qty 1

## 2018-02-07 MED ORDER — TETANUS-DIPHTH-ACELL PERTUSSIS 5-2.5-18.5 LF-MCG/0.5 IM SUSP: 0.5000 mL | Freq: Once | INTRAMUSCULAR | Status: DC

## 2018-02-07 MED ORDER — MISOPROSTOL 25 MCG QUARTER TABLET
25.0000 ug | ORAL_TABLET | ORAL | Status: DC | PRN
  Filled 2018-02-07: qty 1

## 2018-02-07 MED ORDER — EPHEDRINE 5 MG/ML INJ
10.0000 mg | INTRAVENOUS | Status: DC | PRN
  Filled 2018-02-07: qty 2

## 2018-02-07 MED ORDER — SIMETHICONE 80 MG PO CHEW: 80.0000 mg | CHEWABLE_TABLET | ORAL | Status: DC | PRN

## 2018-02-07 MED ORDER — METHYLERGONOVINE MALEATE 0.2 MG/ML IJ SOLN: 0.2000 mg | INTRAMUSCULAR | Status: DC | PRN

## 2018-02-07 MED ORDER — PHENYLEPHRINE 40 MCG/ML (10ML) SYRINGE FOR IV PUSH (FOR BLOOD PRESSURE SUPPORT)
80.0000 ug | PREFILLED_SYRINGE | INTRAVENOUS | Status: DC | PRN
  Filled 2018-02-07: qty 5

## 2018-02-07 MED ORDER — DIPHENHYDRAMINE HCL 25 MG PO CAPS: 25.0000 mg | ORAL_CAPSULE | Freq: Four times a day (QID) | ORAL | Status: DC | PRN

## 2018-02-07 MED ORDER — ZOLPIDEM TARTRATE 5 MG PO TABS: 5.0000 mg | ORAL_TABLET | Freq: Every evening | ORAL | Status: DC | PRN

## 2018-02-07 MED ORDER — FLEET ENEMA 7-19 GM/118ML RE ENEM: 1.0000 | ENEMA | RECTAL | Status: DC | PRN

## 2018-02-07 MED ORDER — PHENYLEPHRINE 40 MCG/ML (10ML) SYRINGE FOR IV PUSH (FOR BLOOD PRESSURE SUPPORT)
80.0000 ug | PREFILLED_SYRINGE | INTRAVENOUS | Status: DC | PRN
  Filled 2018-02-07: qty 10
  Filled 2018-02-07: qty 5

## 2018-02-07 MED ORDER — LIDOCAINE HCL (PF) 1 % IJ SOLN
INTRAMUSCULAR | Status: DC | PRN
  Administered 2018-02-07 (×2): 5 mL

## 2018-02-07 MED ORDER — ONDANSETRON HCL 4 MG/2ML IJ SOLN: 4.0000 mg | INTRAMUSCULAR | Status: DC | PRN

## 2018-02-07 MED ORDER — BENZOCAINE-MENTHOL 20-0.5 % EX AERO
1.0000 "application " | INHALATION_SPRAY | CUTANEOUS | Status: DC | PRN
  Administered 2018-02-08: 1 via TOPICAL
  Filled 2018-02-07: qty 56

## 2018-02-07 MED ORDER — IBUPROFEN 600 MG PO TABS
600.0000 mg | ORAL_TABLET | Freq: Four times a day (QID) | ORAL | Status: DC
Start: 2018-02-07 — End: 2018-02-08
  Administered 2018-02-07 – 2018-02-08 (×4): 600 mg via ORAL
  Filled 2018-02-07 (×5): qty 1

## 2018-02-07 MED ORDER — OXYTOCIN 40 UNITS IN LACTATED RINGERS INFUSION - SIMPLE MED: 1.0000 m[IU]/min | INTRAVENOUS | Status: DC

## 2018-02-07 MED ORDER — MEASLES, MUMPS & RUBELLA VAC ~~LOC~~ INJ
0.5000 mL | INJECTION | Freq: Once | SUBCUTANEOUS | Status: DC
  Filled 2018-02-07: qty 0.5

## 2018-02-07 MED ORDER — LIDOCAINE HCL (PF) 1 % IJ SOLN
30.0000 mL | INTRAMUSCULAR | Status: DC | PRN
  Filled 2018-02-07: qty 30

## 2018-02-07 MED ORDER — TERBUTALINE SULFATE 1 MG/ML IJ SOLN
0.2500 mg | Freq: Once | INTRAMUSCULAR | Status: DC | PRN
  Filled 2018-02-07: qty 1

## 2018-02-07 MED ORDER — ACETAMINOPHEN 325 MG PO TABS: 650.0000 mg | ORAL_TABLET | ORAL | Status: DC | PRN

## 2018-02-07 MED ORDER — OXYCODONE HCL 5 MG PO TABS
5.0000 mg | ORAL_TABLET | ORAL | Status: DC | PRN
  Administered 2018-02-07: 5 mg via ORAL
  Filled 2018-02-07: qty 1

## 2018-02-07 MED ORDER — DIPHENHYDRAMINE HCL 50 MG/ML IJ SOLN: 12.5000 mg | INTRAMUSCULAR | Status: DC | PRN

## 2018-02-07 MED ORDER — FENTANYL 2.5 MCG/ML BUPIVACAINE 1/10 % EPIDURAL INFUSION (WH - ANES)
14.0000 mL/h | INTRAMUSCULAR | Status: DC | PRN
  Administered 2018-02-07: 14 mL/h via EPIDURAL
  Filled 2018-02-07: qty 100

## 2018-02-07 MED ORDER — FENTANYL CITRATE (PF) 100 MCG/2ML IJ SOLN: 50.0000 ug | INTRAMUSCULAR | Status: DC | PRN

## 2018-02-07 MED ORDER — LACTATED RINGERS IV SOLN
500.0000 mL | Freq: Once | INTRAVENOUS | Status: AC
  Administered 2018-02-07: 500 mL via INTRAVENOUS

## 2018-02-07 MED ORDER — COCONUT OIL OIL: 1.0000 "application " | TOPICAL_OIL | Status: DC | PRN

## 2018-02-07 MED ORDER — DIBUCAINE 1 % RE OINT: 1.0000 "application " | TOPICAL_OINTMENT | RECTAL | Status: DC | PRN

## 2018-02-07 MED ORDER — LACTATED RINGERS IV SOLN
INTRAVENOUS | Status: DC
  Administered 2018-02-07: 08:00:00 via INTRAVENOUS

## 2018-02-07 MED ORDER — WITCH HAZEL-GLYCERIN EX PADS: 1.0000 "application " | MEDICATED_PAD | CUTANEOUS | Status: DC | PRN

## 2018-02-07 MED ORDER — OXYCODONE HCL 5 MG PO TABS: 10.0000 mg | ORAL_TABLET | ORAL | Status: DC | PRN

## 2018-02-07 MED ORDER — OXYTOCIN 40 UNITS IN LACTATED RINGERS INFUSION - SIMPLE MED
2.5000 [IU]/h | INTRAVENOUS | Status: DC
  Administered 2018-02-07: 2.5 [IU]/h via INTRAVENOUS
  Filled 2018-02-07: qty 1000

## 2018-02-07 MED ORDER — LACTATED RINGERS IV SOLN: 500.0000 mL | INTRAVENOUS | Status: DC | PRN

## 2018-02-07 NOTE — Plan of Care (Signed)
Patient comfortable with an epidural. No questions at this time.

## 2018-02-07 NOTE — Anesthesia Preprocedure Evaluation (Signed)
Anesthesia Evaluation  Patient identified by MRN, date of birth, ID band Patient awake    Reviewed: Allergy & Precautions, H&P , NPO status , Patient's Chart, lab work & pertinent test results  History of Anesthesia Complications Negative for: history of anesthetic complications  Airway Mallampati: II  TM Distance: >3 FB Neck ROM: full    Dental no notable dental hx. (+) Teeth Intact   Pulmonary neg pulmonary ROS,    Pulmonary exam normal breath sounds clear to auscultation       Cardiovascular negative cardio ROS Normal cardiovascular exam Rhythm:regular Rate:Normal     Neuro/Psych negative neurological ROS  negative psych ROS   GI/Hepatic negative GI ROS, Neg liver ROS,   Endo/Other  Morbid obesity  Renal/GU negative Renal ROS  negative genitourinary   Musculoskeletal   Abdominal   Peds  Hematology negative hematology ROS (+)   Anesthesia Other Findings   Reproductive/Obstetrics (+) Pregnancy                             Anesthesia Physical Anesthesia Plan  ASA: III  Anesthesia Plan: Epidural   Post-op Pain Management:    Induction:   PONV Risk Score and Plan:   Airway Management Planned:   Additional Equipment:   Intra-op Plan:   Post-operative Plan:   Informed Consent: I have reviewed the patients History and Physical, chart, labs and discussed the procedure including the risks, benefits and alternatives for the proposed anesthesia with the patient or authorized representative who has indicated his/her understanding and acceptance.       Plan Discussed with:   Anesthesia Plan Comments:         Anesthesia Quick Evaluation  

## 2018-02-07 NOTE — Progress Notes (Signed)
Sharon Thornton is a 32 y.o. G3P2001 at [redacted]w[redacted]d by LMP admitted for active labor  Subjective: Patient comfortable with epidural   Objective: BP 129/72   Pulse 88   Temp 98 F (36.7 C) (Oral)   Resp 16   Ht 5\' 7"  (1.702 m)   Wt 126.3 kg (278 lb 8 oz)   LMP 05/17/2017   SpO2 100%   BMI 43.62 kg/m  No intake/output data recorded. No intake/output data recorded.  FHT:  FHR: 140 bpm, variability: moderate,  accelerations:  Present,  decelerations:  Absent UC:   regular, every 2--3 minutes SVE:   Dilation: 9 Effacement (%): 80 Station: -1 Exam by:: Izza Bickle, MD AROM: light mec  Labs: Lab Results  Component Value Date   WBC 10.9 (H) 02/07/2018   HGB 10.9 (L) 02/07/2018   HCT 33.4 (L) 02/07/2018   MCV 74.7 (L) 02/07/2018   PLT 396 02/07/2018    Assessment / Plan: Spontaneous labor, progressing normally  Labor: Progressing normally Preeclampsia:  no signs or symptoms of toxicity Fetal Wellbeing:  Category I Pain Control:  Epidural I/D:  n/a Anticipated MOD:  NSVD  Tonee Silverstein STACIA 02/07/2018, 11:41 AM

## 2018-02-07 NOTE — H&P (Signed)
Sharon Thornton is a 32 y.o. female G3P2002 at 40 weeks presenting for painful regular contractions. Patient denies LOF, reports bloody show and notes normal fetal movement.  She was scheduled already for IOL today.   OB History    Gravida  3   Para  2   Term  2   Preterm      AB      Living  1     SAB      TAB      Ectopic      Multiple      Live Births             Past Medical History:  Diagnosis Date  . Anemia   . No pertinent past medical history    Past Surgical History:  Procedure Laterality Date  . WISDOM TOOTH EXTRACTION    . WRIST SURGERY     Family History: family history includes Cancer in her paternal grandmother; Diabetes in her maternal grandfather, maternal grandmother, and paternal grandmother; Heart disease in her maternal grandfather, paternal aunt, and paternal uncle; Hypertension in her maternal grandfather and maternal grandmother. Social History:  reports that she has never smoked. She has never used smokeless tobacco. She reports that she drinks alcohol. She reports that she does not use drugs.     Maternal Diabetes: No Genetic Screening: Declined Maternal Ultrasounds/Referrals: Normal Fetal Ultrasounds or other Referrals:  None Maternal Substance Abuse:  No Significant Maternal Medications:  None Significant Maternal Lab Results:  Lab values include: Group B Strep negative Other Comments:  None  ROS History On admission : Dilation: 6cm  Blood pressure 129/72, pulse 88, temperature 98 F (36.7 C), temperature source Oral, resp. rate 16, height 5\' 7"  (1.702 m), weight 126.3 kg (278 lb 8 oz), last menstrual period 05/17/2017, SpO2 100 %. Exam Physical Exam  Prenatal labs: ABO, Rh: --/--/O POS (03/29 0802) Antibody: NEG (03/29 0802) Rubella: Nonimmune (08/09 0000) RPR: Nonreactive (01/02 0000)  HBsAg: Negative (08/09 0000)  HIV: Non-reactive (01/02 0000)  GBS: Negative (02/27 0000)   Assessment/Plan: 32 yo G3P2002 SIUP at 40  weeks in active labor Admit to Labor and Delivery  Continuous monitoring Epidural  Anticipate NSVD   Herve Haug, [place] 02/07/2018, 11:34 AM

## 2018-02-07 NOTE — Progress Notes (Addendum)
2031: Pt C/O abdominal cramping 5/10 but feels like it is progressively getting worst. Oxi 5 mg po prn administered. We will reassess in 1 hr.  2131: Pain medication was effective, score 1/10

## 2018-02-07 NOTE — Plan of Care (Addendum)
Pt is voiding satisfactorily, no urinary retention noted or reported. Pt is also ambulating in her room and demonstrates positive interaction with her baby.

## 2018-02-07 NOTE — Progress Notes (Signed)
RN spoke with Nancy,CNM about putting induction on hold; CNM agreed that pt may be on hold; K.Yasuko Lapage,RNC called and left a message on pt's phone and father's phone notifying them of induction placed on hold and to call labor and delivery when they get the message.

## 2018-02-07 NOTE — Anesthesia Postprocedure Evaluation (Signed)
Anesthesia Post Note  Patient: Sharon Thornton  Procedure(s) Performed: AN AD [place]     Patient location during evaluation: Mother Baby Anesthesia Type: Epidural Level of consciousness: awake Pain management: satisfactory to patient Vital Signs Assessment: post-procedure vital signs reviewed and stable Respiratory status: spontaneous breathing Cardiovascular status: stable Anesthetic complications: no    Last Vitals:  Vitals:   02/07/18 1510 02/07/18 1607  BP: 140/69 (!) 126/58  Pulse: 79 68  Resp: 18 18  Temp: 37.2 C 37.1 C  SpO2:      Last Pain:  Vitals:   02/07/18 1607  TempSrc: Oral  PainSc: 5    Pain Goal: Patients Stated Pain Goal: 4 (02/07/18 1330)               Casimer Lanius

## 2018-02-07 NOTE — Anesthesia Procedure Notes (Signed)
Epidural Patient location during procedure: OB  Staffing Anesthesiologist: Velvet Moomaw, MD Performed: anesthesiologist   Preanesthetic Checklist Completed: patient identified, site marked, surgical consent, pre-op evaluation, timeout performed, IV checked, risks and benefits discussed and monitors and equipment checked  Epidural Patient position: sitting Prep: DuraPrep Patient monitoring: heart rate, continuous pulse ox and blood pressure Approach: right paramedian Location: L3-L4 Injection technique: LOR saline  Needle:  Needle type: Tuohy  Needle gauge: 17 G Needle length: 9 cm and 9 Needle insertion depth: 6 cm Catheter type: closed end flexible Catheter size: 20 Guage Catheter at skin depth: 10 cm Test dose: negative  Assessment Events: blood not aspirated, injection not painful, no injection resistance, negative IV test and no paresthesia  Additional Notes Patient identified. Risks/Benefits/Options discussed with patient including but not limited to bleeding, infection, nerve damage, paralysis, failed block, incomplete pain control, headache, blood pressure changes, nausea, vomiting, reactions to medication both or allergic, itching and postpartum back pain. Confirmed with bedside nurse the patient's most recent platelet count. Confirmed with patient that they are not currently taking any anticoagulation, have any bleeding history or any family history of bleeding disorders. Patient expressed understanding and wished to proceed. All questions were answered. Sterile technique was used throughout the entire procedure. Please see nursing notes for vital signs. Test dose was given through epidural needle and negative prior to continuing to dose epidural or start infusion. Warning signs of high block given to the patient including shortness of breath, tingling/numbness in hands, complete motor block, or any concerning symptoms with instructions to call for help. Patient was given  instructions on fall risk and not to get out of bed. All questions and concerns addressed with instructions to call with any issues.     

## 2018-02-08 LAB — CBC
HEMATOCRIT: 28.4 % — AB (ref 36.0–46.0)
Hemoglobin: 9.2 g/dL — ABNORMAL LOW (ref 12.0–15.0)
MCH: 24.1 pg — ABNORMAL LOW (ref 26.0–34.0)
MCHC: 32.4 g/dL (ref 30.0–36.0)
MCV: 74.5 fL — ABNORMAL LOW (ref 78.0–100.0)
Platelets: 351 10*3/uL (ref 150–400)
RBC: 3.81 MIL/uL — ABNORMAL LOW (ref 3.87–5.11)
RDW: 16 % — AB (ref 11.5–15.5)
WBC: 9.5 10*3/uL (ref 4.0–10.5)

## 2018-02-08 MED ORDER — IBUPROFEN 600 MG PO TABS: 600.0000 mg | ORAL_TABLET | Freq: Four times a day (QID) | ORAL | 0 refills | Status: DC

## 2018-02-08 NOTE — Discharge Summary (Signed)
OB Discharge Summary     Patient Name: Sharon Thornton DOB: 07/11/1986 MRN: [id number]  Date of admission: 02/07/2018 Delivering MD: Sanjuana Kava   Date of discharge: 02/08/2018  Admitting diagnosis: INDUCTION Intrauterine pregnancy: [redacted]w[redacted]d     Secondary diagnosis:  Active Problems:   Obesity affecting pregnancy in third trimester  Additional problems: Shoulder dystocia     Discharge diagnosis: Term Pregnancy Delivered                                                                                                Post partum procedures:None  Augmentation: None  Complications: None  Hospital course:  Onset of Labor With Vaginal Delivery     32 y.o. yo [id number] at [redacted]w[redacted]d was admitted in Active Labor on 02/07/2018. Patient had an uncomplicated labor course as follows:  Membrane Rupture Time/Date: 10:53 AM ,02/07/2018   Intrapartum Procedures: Episiotomy: None [1]                                         Lacerations:  None [1]  Patient had a delivery of a Viable infant. 02/07/2018  Information for the patient's newborn:  [place], Wetherell [[id number]]  Delivery Method: Vaginal, Spontaneous(Filed from Delivery Summary)    Pateint had an uncomplicated postpartum course.  She is ambulating, tolerating a regular diet, passing flatus, and urinating well. Patient is discharged home in stable condition on 02/08/18.   Physical exam  Vitals:   02/07/18 1510 02/07/18 1607 02/07/18 2000 02/08/18 0603  BP: 140/69 (!) 126/58 118/65 (!) 109/52  Pulse: 79 68 70 63  Resp: 18 18 16 16   Temp: 99 F (37.2 C) 98.7 F (37.1 C) 99 F (37.2 C)   TempSrc: Oral Oral Oral   SpO2:   99% 99%  Weight:      Height:       General: alert, cooperative and no distress Lochia: appropriate Uterine Fundus: firm Incision: N/A DVT Evaluation: No evidence of DVT seen on physical exam. Negative Homan's sign. Labs: Lab Results  Component Value Date   WBC 9.5 02/08/2018   HGB 9.2 (L) 02/08/2018   HCT 28.4 (L)  02/08/2018   MCV 74.5 (L) 02/08/2018   PLT 351 02/08/2018   CMP Latest Ref Rng & Units 11/04/2012  Glucose 70 - 99 mg/dL 83  BUN 6 - 23 mg/dL 5(L)  Creatinine 0.50 - 1.10 mg/dL 0.56  Sodium 135 - 145 mEq/L 134(L)  Potassium 3.5 - 5.1 mEq/L 3.7  Chloride 96 - 112 mEq/L 101  CO2 19 - 32 mEq/L 22  Calcium 8.4 - 10.5 mg/dL 8.9  Total Protein 6.0 - 8.3 g/dL 6.4  Total Bilirubin 0.3 - 1.2 mg/dL 0.4  Alkaline Phos 39 - 117 U/L 52  AST 0 - 37 U/L 18  ALT 0 - 35 U/L 10    Discharge instruction: per After Visit Summary and "Baby and Me Booklet".  After visit meds:  PNV, Motrin  Diet: routine diet  Activity: Advance as tolerated.  Pelvic rest for 6 weeks.   Outpatient follow up:6 weeks Follow up Appt:No future appointments. Follow up Visit:No follow-ups on file.  Postpartum contraception: Undecided  Newborn Data: Live born female  Birth Weight: 8 lb 5.9 oz (3795 g) APGAR: 9, 10  Newborn Delivery   Time head delivered:  02/07/2018 13:18:00 Birth date/time:  02/07/2018 13:18:00 Delivery type:  Vaginal, Spontaneous     Baby Feeding: Breast Disposition:home with mother   02/08/2018 Starla Link, CNM

## 2018-02-08 NOTE — Progress Notes (Signed)
Parent request formula to supplement breast feeding due to plans to only pump and give breast milk, until milk volume is in pt will give formula. Parents have been informed of small tummy size of newborn, taught hand expression and understands the possible consequences of formula to the health of the infant. The possible consequences shared with patent include 1) Loss of confidence in breastfeeding 2) Engorgement 3) Allergic sensitization of baby(asthema/allergies) and 4) decreased milk supply for mother.After discussion of the above the mother decided to continue with her Plan of Care.The  tool used to give formula supplement will be bottle nipple.

## 2019-01-29 IMAGING — DX DG SHOULDER 2+V*R*
3 series · 3 of 3 positions shown · non-contrast
Comparison: None.

CLINICAL DATA: Right shoulder pain-worse under arm onset today
after [REDACTED]rding. Initial encounter.

EXAM:
RIGHT SHOULDER - 2+ VIEW

[shoulder grashey]
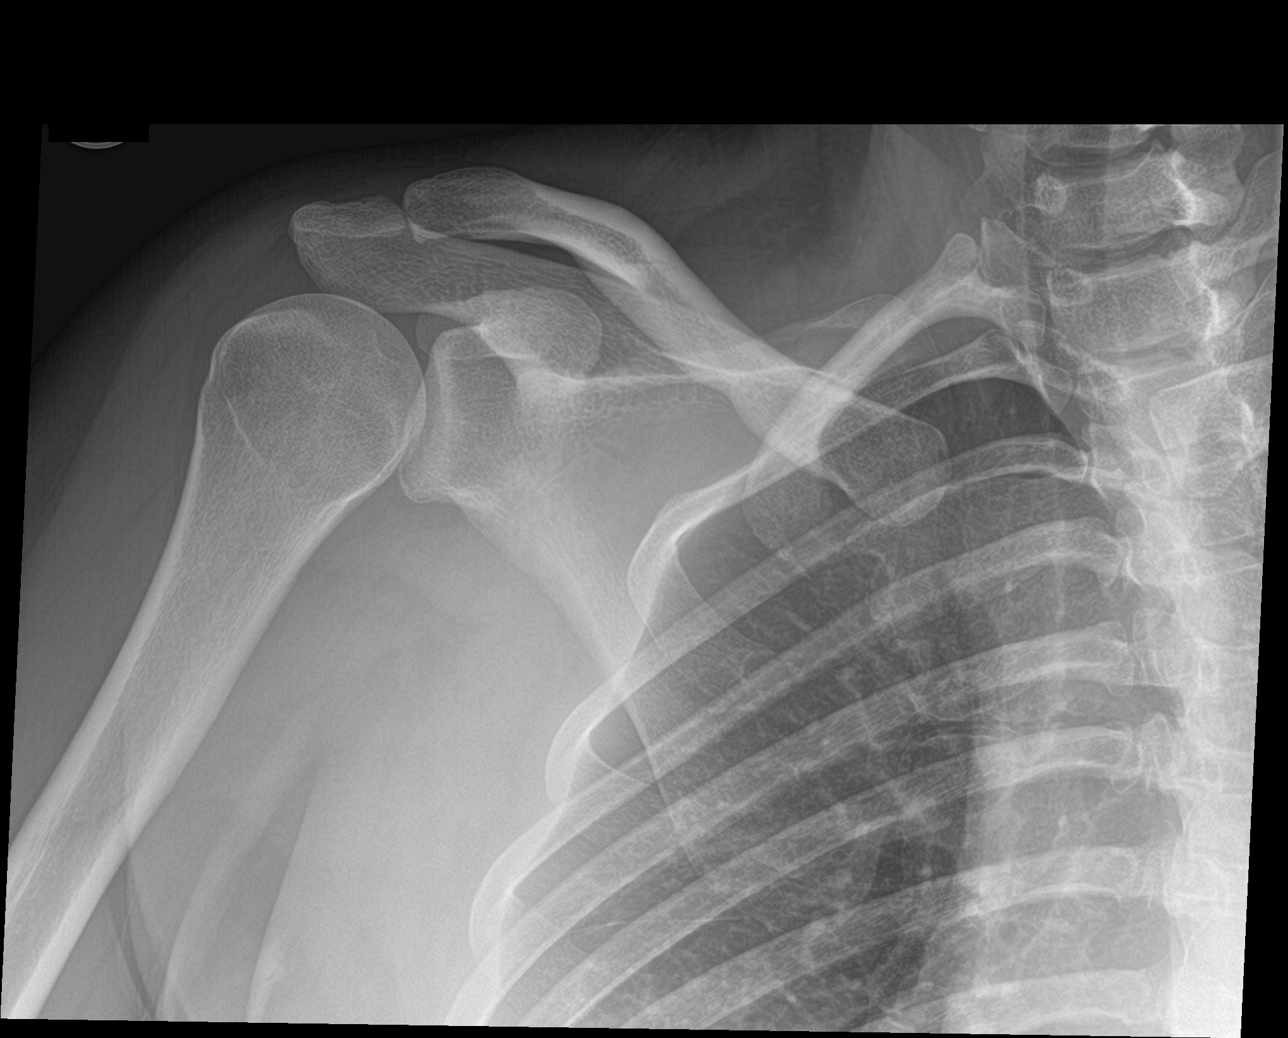

[shoulder y view]
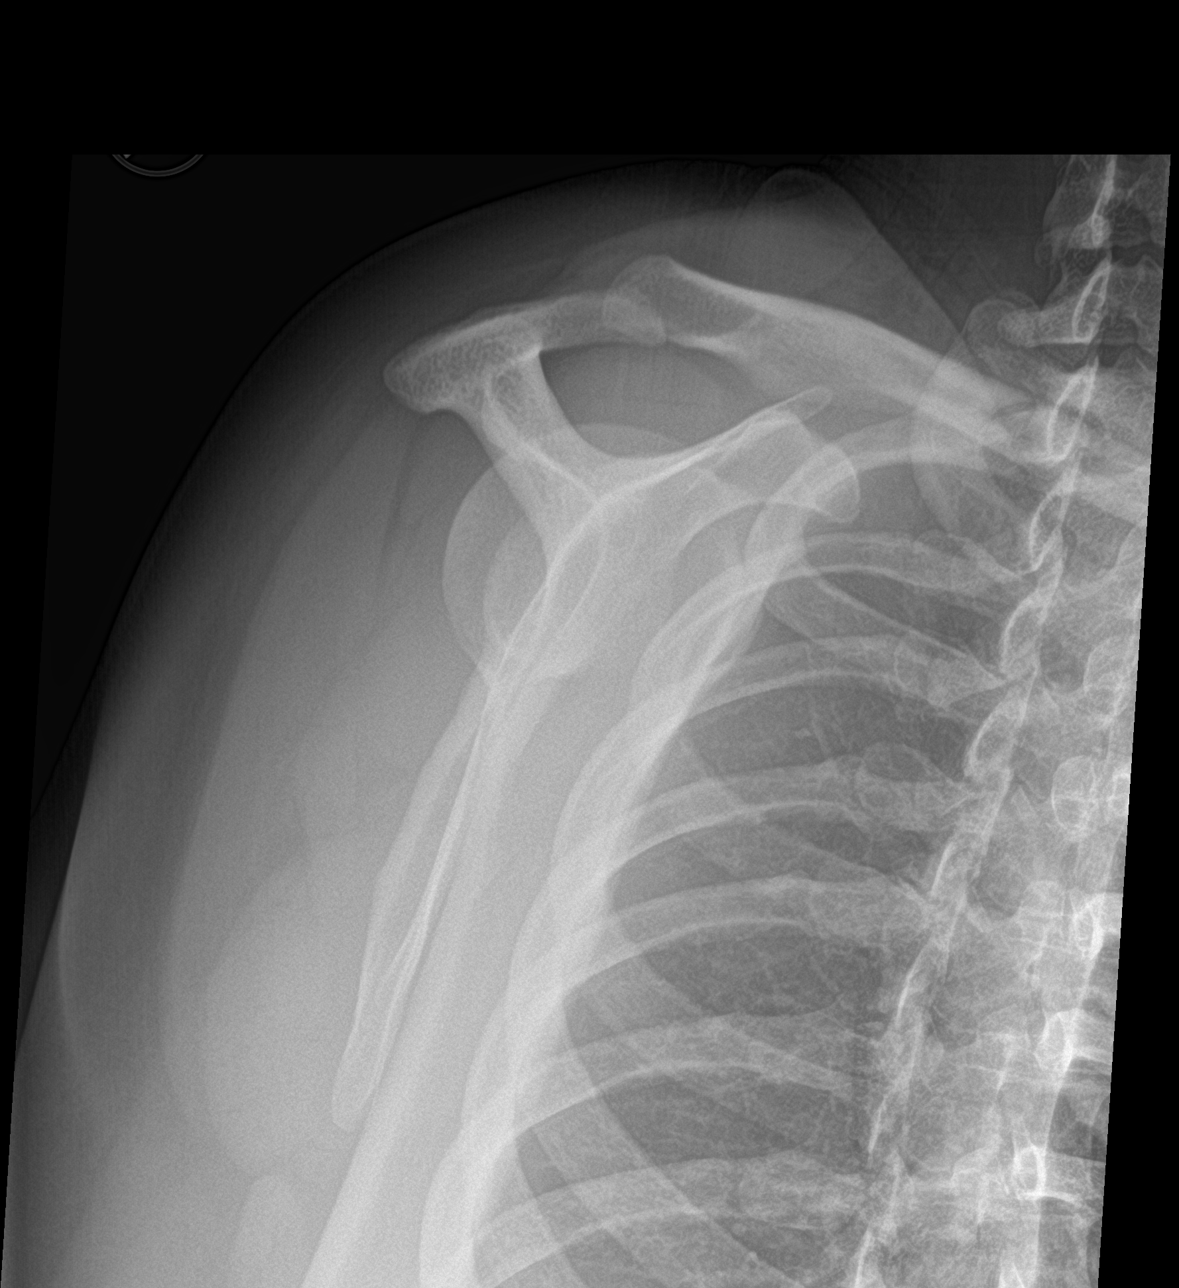

[shoulder axillary]
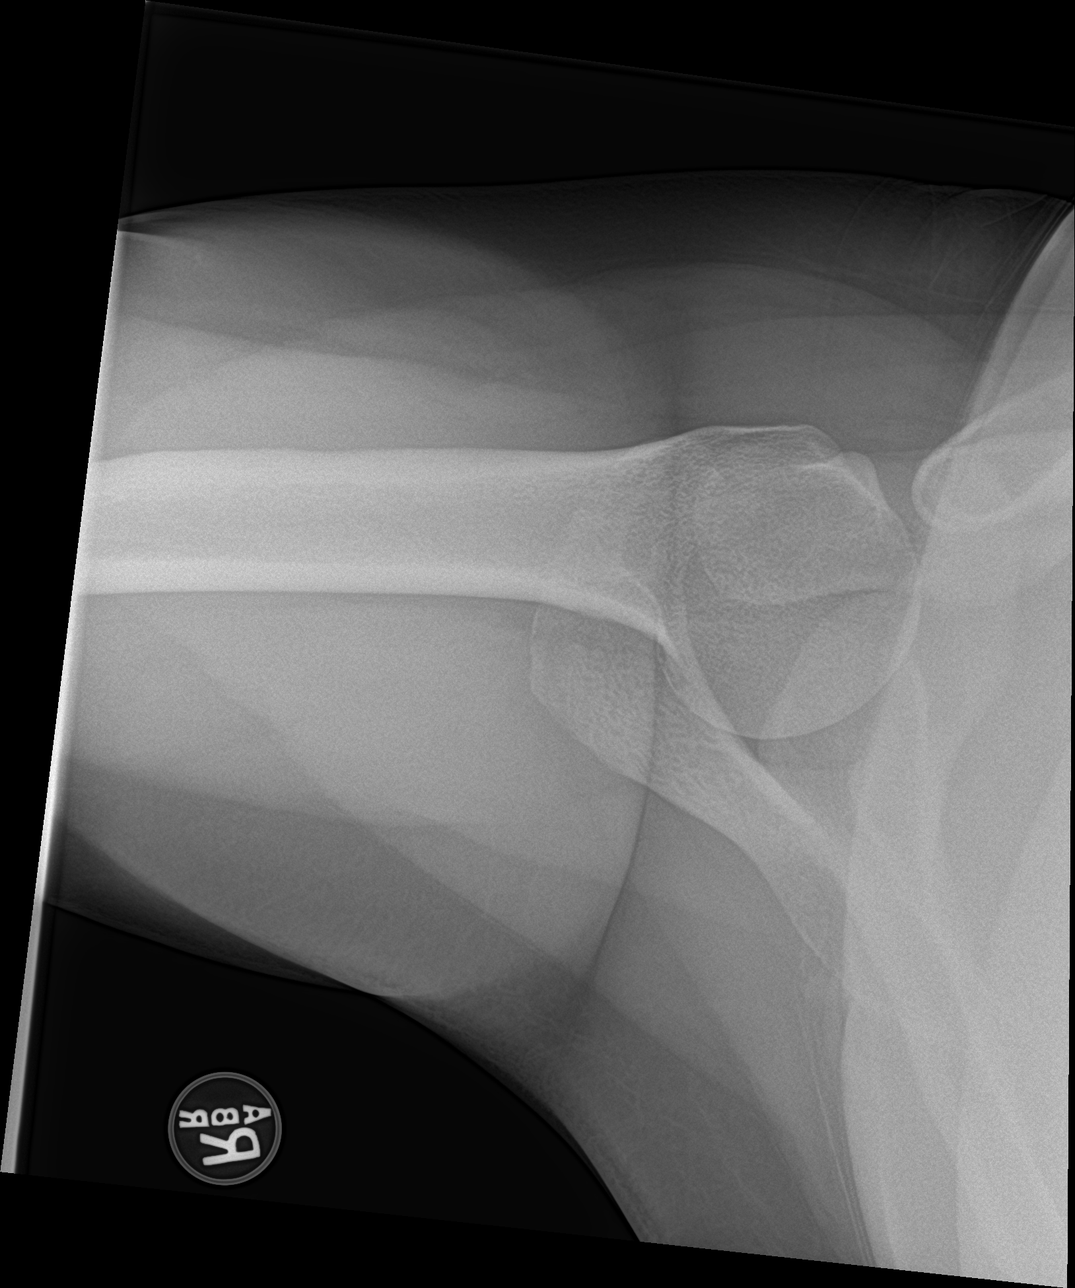

[3 of 3 positions shown; findings below may reference images not displayed]

FINDINGS: There is no evidence of fracture or dislocation. There is no
evidence of arthropathy or other focal bone abnormality. Soft
tissues are unremarkable.
IMPRESSION: No acute osseous injury of the right shoulder.

## 2019-05-18 ENCOUNTER — Encounter: Payer: Self-pay | Admitting: Emergency Medicine

## 2019-05-18 ENCOUNTER — Emergency Department
Admission: EM | Admit: 2019-05-18 | Discharge: 2019-05-18 | Disposition: A | Discharge status: Home / Self Care | Payer: Medicaid Other | Attending: Emergency Medicine | Admitting: Emergency Medicine

## 2019-05-18 ENCOUNTER — Other Ambulatory Visit: Payer: Self-pay

## 2019-05-18 DIAGNOSIS — B029 Zoster without complications: Secondary | ICD-10-CM | POA: Diagnosis not present

## 2019-05-18 DIAGNOSIS — R21 Rash and other nonspecific skin eruption: Secondary | ICD-10-CM | POA: Diagnosis present

## 2019-05-18 MED ORDER — LIDOCAINE 5 % EX PTCH: 1.0000 | MEDICATED_PATCH | Freq: Two times a day (BID) | CUTANEOUS | 0 refills | Status: DC

## 2019-05-18 MED ORDER — HYDROXYZINE HCL 50 MG PO TABS: 50.0000 mg | ORAL_TABLET | Freq: Three times a day (TID) | ORAL | 0 refills | Status: DC | PRN

## 2019-05-18 MED ORDER — ACYCLOVIR 400 MG PO TABS: 400.0000 mg | ORAL_TABLET | Freq: Every day | ORAL | 0 refills | Status: AC

## 2019-05-18 MED ORDER — LIDOCAINE 5 % EX PTCH
1.0000 | MEDICATED_PATCH | CUTANEOUS | Status: DC
  Administered 2019-05-18: 12:00:00 1 via TRANSDERMAL
  Filled 2019-05-18: qty 1

## 2019-05-18 NOTE — ED Provider Notes (Signed)
[facility] Emergency Department Provider Note   ____________________________________________   First MD Initiated Contact with Patient 05/18/19 1126     (approximate)  I have reviewed the triage vital signs and the nursing notes.   HISTORY  Chief Complaint Rash    HPI Sharon Thornton is a 33 y.o. female patient presents with a rash that starts in her hairline to mid forehead.  Patient states the rash has a "burning" sensation.  Patient state initially she thought it was secondary to using hair dye but the rash is only localized to the frontal area.  Patient denies any rash to the hairline.  Rash is localized to the left side of forehead.  Patient rates pain as a 5/10.         Past Medical History:  Diagnosis Date   Anemia    No pertinent past medical history     Patient Active Problem List   Diagnosis Date Noted   Obesity affecting pregnancy in third trimester 02/07/2018    Past Surgical History:  Procedure Laterality Date   WISDOM TOOTH EXTRACTION     WRIST SURGERY      Prior to Admission medications   Medication Sig Start Date End Date Taking? Authorizing Provider  acyclovir (ZOVIRAX) 400 MG tablet Take 1 tablet (400 mg total) by mouth 5 (five) times daily for 10 days. 05/18/19 05/28/19  Sable Feil, PA-C  hydrOXYzine (ATARAX/VISTARIL) 50 MG tablet Take 1 tablet (50 mg total) by mouth 3 (three) times daily as needed for itching. 05/18/19   Sable Feil, PA-C  lidocaine (LIDODERM) 5 % Place 1 patch onto the skin every 12 (twelve) hours. Remove & Discard patch within 12 hours or as directed by MD 05/18/19 05/17/20  Sable Feil, PA-C    Allergies Patient has no known allergies.  Family History  Problem Relation Age of Onset   Heart disease Paternal Aunt    Heart disease Paternal Uncle    Diabetes Maternal Grandmother    Hypertension Maternal Grandmother    Diabetes Maternal Grandfather    Heart disease Maternal Grandfather     Hypertension Maternal Grandfather    Diabetes Paternal Grandmother    Cancer Paternal Grandmother     Social History Social History   Tobacco Use   Smoking status: Never Smoker   Smokeless tobacco: Never Used  Substance Use Topics   Alcohol use: Yes    Comment: rarely   Drug use: No    Review of Systems  Constitutional: No fever/chills Eyes: No visual changes. ENT: No sore throat. Cardiovascular: Denies chest pain. Respiratory: Denies shortness of breath. Gastrointestinal: No abdominal pain.  No nausea, no vomiting.  No diarrhea.  No constipation. Genitourinary: Negative for dysuria. Musculoskeletal: Negative for back pain. Skin: Positive for rash. Neurological: Negative for headaches, focal weakness or numbness.   ____________________________________________   PHYSICAL EXAM:  VITAL SIGNS: ED Triage Vitals  Enc Vitals Group     BP 05/18/19 1038 122/79     Pulse Rate 05/18/19 1038 66     Resp 05/18/19 1038 20     Temp 05/18/19 1038 98.4 F (36.9 C)     Temp Source 05/18/19 1038 Oral     SpO2 05/18/19 1038 100 %     Weight 05/18/19 1036 221 lb (100.2 kg)     Height 05/18/19 1036 5\' 7"  (1.702 m)     Head Circumference --      Peak Flow --  Pain Score 05/18/19 1036 5     Pain Loc --      Pain Edu? --      Excl. in [place]? --    Constitutional: Alert and oriented. Well appearing and in no acute distress. Cardiovascular: Normal rate, regular rhythm. Grossly normal heart sounds.  Good peripheral circulation. Respiratory: Normal respiratory effort.  No retractions. Lungs CTAB. Neurologic:  Normal speech and language. No gross focal neurologic deficits are appreciated. No gait instability. Skin:  Skin is warm, dry and intact.  Multiple vesicles lesions left forehead. Psychiatric: Mood and affect are normal. Speech and behavior are normal.  ____________________________________________   LABS (all labs ordered are listed, but only abnormal results are  displayed)  Labs Reviewed - No data to display ____________________________________________  EKG   ____________________________________________  RADIOLOGY  ED MD interpretation:    Official radiology report(s): No results found.  ____________________________________________   PROCEDURES  Procedure(s) performed (including Critical Care):  Procedures   ____________________________________________   INITIAL IMPRESSION / ASSESSMENT AND PLAN / ED COURSE  As part of my medical decision making, I reviewed the following data within the [place] was evaluated in Emergency Department on 05/18/2019 for the symptoms described in the history of present illness. She was evaluated in the context of the global COVID-19 pandemic, which necessitated consideration that the patient might be at risk for infection with the SARS-CoV-2 virus that causes COVID-19. Institutional protocols and algorithms that pertain to the evaluation of patients at risk for COVID-19 are in a state of rapid change based on information released by regulatory bodies including the CDC and federal and state organizations. These policies and algorithms were followed during the patient's care in the ED.       ____________________________________________   FINAL CLINICAL IMPRESSION(S) / ED DIAGNOSES  Final diagnoses:  Herpes zoster without complication     ED Discharge Orders         Ordered    acyclovir (ZOVIRAX) 400 MG tablet  5 times daily     05/18/19 1135    hydrOXYzine (ATARAX/VISTARIL) 50 MG tablet  3 times daily PRN     05/18/19 1135    lidocaine (LIDODERM) 5 %  Every 12 hours     05/18/19 1135           Note:  This document was prepared using Dragon voice recognition software and may include unintentional dictation errors.    Sable Feil, PA-C 05/18/19 1140    Harvest Dark, MD 05/18/19 1446

## 2019-05-18 NOTE — ED Triage Notes (Signed)
Pt states got her hair colored last week and then started with a rash on her head and through her hair. Pt states at first she thought it was a reaction to the hair dye because she had not done it in so long but now she reports she thinks it may be shingles. Pt reports a tingling burning pain.

## 2019-05-18 NOTE — ED Notes (Signed)
See triage note  Presents with rash to left side of forehead and spreading into eye

## 2019-06-18 ENCOUNTER — Telehealth: Payer: Medicaid Other | Admitting: Physician Assistant

## 2019-06-18 DIAGNOSIS — Z20828 Contact with and (suspected) exposure to other viral communicable diseases: Secondary | ICD-10-CM

## 2019-06-18 DIAGNOSIS — R05 Cough: Secondary | ICD-10-CM

## 2019-06-18 DIAGNOSIS — R059 Cough, unspecified: Secondary | ICD-10-CM

## 2019-06-18 DIAGNOSIS — Z20822 Contact with and (suspected) exposure to covid-19: Secondary | ICD-10-CM

## 2019-06-18 MED ORDER — BENZONATATE 100 MG PO CAPS: 100.0000 mg | ORAL_CAPSULE | Freq: Three times a day (TID) | ORAL | 0 refills | Status: DC | PRN

## 2019-06-18 NOTE — Progress Notes (Signed)
E-Visit for Corona Virus Screening   Your current symptoms could be consistent with the coronavirus.  Many health care providers can now test patients at their office but not all are.  [facility] has multiple testing sites. For information on our COVID testing locations and hours go to [email or web address]  Please quarantine yourself while awaiting your test results.  We are enrolling you in our [place] for [place] . Daily you will receive a questionnaire within the [place] website. Our COVID 19 response team willl be monitoriing your responses daily.    COVID-19 is a respiratory illness with symptoms that are similar to the flu. Symptoms are typically mild to moderate, but there have been cases of severe illness and death due to the virus. The following symptoms may appear 2-14 days after exposure: . Fever . Cough . Shortness of breath or difficulty breathing . Chills . Repeated shaking with chills . Muscle pain . Headache . Sore throat . New loss of taste or smell . Fatigue . Congestion or runny nose . Nausea or vomiting . Diarrhea  It is vitally important that if you feel that you have an infection such as this virus or any other virus that you stay home and away from places where you may spread it to others.  You should self-quarantine for 14 days if you have symptoms that could potentially be coronavirus or have been in close contact a with a person diagnosed with COVID-19 within the last 2 weeks. You should avoid contact with people age 69 and older.   You should wear a mask or cloth face covering over your nose and mouth if you must be around other people or animals, including pets (even at home). Try to stay at least 6 feet away from other people. This will protect the people around you.  You can use medication such as A prescription cough medication called Tessalon Perles 100 mg. You may take 1-2 capsules every 8 hours as needed for  cough  You may also take acetaminophen (Tylenol) as needed for fever.   Reduce your risk of any infection by using the same precautions used for avoiding the common cold or flu:  Marland Kitchen Wash your hands often with soap and warm water for at least 20 seconds.  If soap and water are not readily available, use an alcohol-based hand sanitizer with at least 60% alcohol.  . If coughing or sneezing, cover your mouth and nose by coughing or sneezing into the elbow areas of your shirt or coat, into a tissue or into your sleeve (not your hands). . Avoid shaking hands with others and consider head nods or verbal greetings only. . Avoid touching your eyes, nose, or mouth with unwashed hands.  . Avoid close contact with people who are sick. . Avoid places or events with large numbers of people in one location, like concerts or sporting events. . Carefully consider travel plans you have or are making. . If you are planning any travel outside or inside the [place], visit the CDC's Travelers' Health webpage for the latest health notices. . If you have some symptoms but not all symptoms, continue to monitor at home and seek medical attention if your symptoms worsen. . If you are having a medical emergency, call 911.  HOME CARE . Only take medications as instructed by your medical team. . Drink plenty of fluids and get plenty of rest. . A steam or ultrasonic humidifier can help if you have congestion.  GET HELP RIGHT AWAY IF YOU HAVE EMERGENCY WARNING SIGNS** FOR COVID-19. If you or someone is showing any of these signs seek emergency medical care immediately. Call 911 or proceed to your closest emergency facility if: . You develop worsening high fever. . Trouble breathing . Bluish lips or face . Persistent pain or pressure in the chest . New confusion . Inability to wake or stay awake . You cough up blood. . Your symptoms become more severe  **This list is not all possible symptoms. Contact your medical  provider for any symptoms that are sever or concerning to you.   MAKE SURE YOU   Understand these instructions.  Will watch your condition.  Will get help right away if you are not doing well or get worse.  Your e-visit answers were reviewed by a board certified advanced clinical practitioner to complete your personal care plan.  Depending on the condition, your plan could have included both over the counter or prescription medications.  If there is a problem please reply once you have received a response from your provider.  Your safety is important to [place].  If you have drug allergies check your prescription carefully.    You can use MyChart to ask questions about today's visit, request a non-urgent call back, or ask for a work or school excuse for 24 hours related to this e-Visit. If it has been greater than 24 hours you will need to follow up with your provider, or enter a new e-Visit to address those concerns. You will get an e-mail in the next two days asking about your experience.  I hope that your e-visit has been valuable and will speed your recovery. Thank you for using e-visits.   I have spent 5 minutes in review of e-visit questionnaire, review and updating patient chart, medical decision making and response to patient.    Tenna Delaine, PA-C

## 2019-06-30 ENCOUNTER — Encounter: Payer: Self-pay | Admitting: Gastroenterology

## 2019-08-05 ENCOUNTER — Encounter: Payer: Self-pay | Admitting: Gastroenterology

## 2019-08-05 ENCOUNTER — Ambulatory Visit: Payer: Medicaid Other | Admitting: Gastroenterology

## 2019-08-05 ENCOUNTER — Other Ambulatory Visit: Payer: Self-pay

## 2019-08-05 VITALS — BP 112/70 | HR 81 | Temp 98.0°F | Ht 67.0 in | Wt 222.1 lb

## 2019-08-05 DIAGNOSIS — Z800 Family history of malignant neoplasm of digestive organs: Secondary | ICD-10-CM | POA: Diagnosis not present

## 2019-08-05 MED ORDER — NA SULFATE-K SULFATE-MG SULF 17.5-3.13-1.6 GM/177ML PO SOLN: ORAL | 0 refills | Status: DC

## 2019-08-05 NOTE — Progress Notes (Signed)
Sharon Thornton    [id number]    [birth year]  Primary Care Physician:Patient, No Pcp Per  Referring Physician: Sanjuana Kava, MD [address] [place],  [place] [id number]   Chief complaint: Family history of colon cancer HPI: 33 year old female with family history of colon cancer is here to discuss colorectal cancer screening. Her father had stage III colon cancer diagnosed at age 49 and her grandmother had breast cancer and also colon cancer in her 86s dad had colono cancer at age 62.  She is not sure if any of her cousins or paternal uncles have any cancer history. She has chronic intermittent constipation with bowel movement every other day, no recent change in bowel habits. Denies any nausea, vomiting, abdominal pain, melena or bright red blood per rectum  Outpatient Encounter Medications as of 08/05/2019  Medication Sig  . benzonatate (TESSALON) 100 MG capsule Take 1-2 capsules (100-200 mg total) by mouth 3 (three) times daily as needed for cough.  . hydrOXYzine (ATARAX/VISTARIL) 50 MG tablet Take 1 tablet (50 mg total) by mouth 3 (three) times daily as needed for itching.  . lidocaine (LIDODERM) 5 % Place 1 patch onto the skin every 12 (twelve) hours. Remove & Discard patch within 12 hours or as directed by MD   No facility-administered encounter medications on file as of 08/05/2019.     Allergies as of 08/05/2019  . (No Known Allergies)    Past Medical History:  Diagnosis Date  . Anemia   . No pertinent past medical history     Past Surgical History:  Procedure Laterality Date  . WISDOM TOOTH EXTRACTION    . WRIST SURGERY      Family History  Problem Relation Age of Onset  . Heart disease Paternal Aunt   . Heart disease Paternal Uncle   . Diabetes Maternal Grandmother   . Hypertension Maternal Grandmother   . Diabetes Maternal Grandfather   . Heart disease Maternal Grandfather   . Hypertension Maternal Grandfather   . Diabetes Paternal  Grandmother   . Cancer Paternal Grandmother     Social History   Socioeconomic History  . Marital status: Married    Spouse name: Not on file  . Number of children: Not on file  . Years of education: Not on file  . Highest education level: Not on file  Occupational History  . Not on file  Social Needs  . Financial resource strain: Not on file  . Food insecurity    Worry: Not on file    Inability: Not on file  . Transportation needs    Medical: Not on file    Non-medical: Not on file  Tobacco Use  . Smoking status: Never Smoker  . Smokeless tobacco: Never Used  Substance and Sexual Activity  . Alcohol use: Yes    Comment: rarely  . Drug use: No  . Sexual activity: Yes    Birth control/protection: I.U.D.  Lifestyle  . Physical activity    Days per week: Not on file    Minutes per session: Not on file  . Stress: Not on file  Relationships  . Social Herbalist on phone: Not on file    Gets together: Not on file    Attends religious service: Not on file    Active member of club or organization: Not on file    Attends meetings of clubs or organizations: Not on file  Relationship status: Not on file  . Intimate partner violence    Fear of current or ex partner: Not on file    Emotionally abused: Not on file    Physically abused: Not on file    Forced sexual activity: Not on file  Other Topics Concern  . Not on file  Social History Narrative  . Not on file      Review of systems: Review of Systems  Constitutional: Negative for fever and chills.  HENT: Negative.   Eyes: Negative for blurred vision.  Respiratory: Negative for cough, shortness of breath and wheezing.   Cardiovascular: Negative for chest pain and palpitations.  Gastrointestinal: as per HPI Genitourinary: Negative for dysuria, urgency, frequency and hematuria.  Musculoskeletal: Negative for myalgias, back pain and joint pain.  Skin: Negative for itching and rash.  Neurological:  Negative for dizziness, tremors, focal weakness, seizures and loss of consciousness.  Endo/Heme/Allergies: Negative for seasonal allergies.  Psychiatric/Behavioral: Negative for depression, suicidal ideas and hallucinations.  All other systems reviewed and are negative.   Physical Exam: Vitals:   08/05/19 0851  BP: 112/70  Pulse: 81  Temp: 98 F (36.7 C)   Body mass index is 34.79 kg/m. Gen:      No acute distress HEENT:  EOMI, sclera anicteric Neck:     No masses; no thyromegaly Lungs:    Clear to auscultation bilaterally; normal respiratory effort CV:         Regular rate and rhythm; no murmurs Abd:      + bowel sounds; soft, non-tender; no palpable masses, no distension Ext:    No edema; adequate peripheral perfusion Skin:      Warm and dry; no rash Neuro: alert and oriented x 3 Psych: normal mood and affect  Data Reviewed:  Reviewed labs, radiology imaging, old records and pertinent past GI work up   Assessment and Plan/Recommendations:  33 year old female with family history of colon cancer in grandmother and father in their 34s and 44s. We will schedule colonoscopy for colorectal cancer screening Referral to genetics counseling/testing to exclude Lynch syndrome or FAP May need to consider EGD based on genetics testing and will also help to determine the interval for surveillance colonoscopy. The risks and benefits as well as alternatives of endoscopic procedure(s) have been discussed and reviewed. All questions answered. The patient agrees to proceed.   Damaris Hippo , MD    CC: Sanjuana Kava, MD

## 2019-08-05 NOTE — Patient Instructions (Signed)
You have been scheduled for a colonoscopy. Please follow written instructions given to you at your visit today.  Please pick up your prep supplies at the pharmacy within the next 1-3 days. If you use inhalers (even only as needed), please bring them with you on the day of your procedure.   If you are age 32 or older, your body mass index should be between 23-30. Your Body mass index is 34.79 kg/m. If this is out of the aforementioned range listed, please consider follow up with your Primary Care Provider.  If you are age 57 or younger, your body mass index should be between 19-25. Your Body mass index is 34.79 kg/m. If this is out of the aformentioned range listed, please consider follow up with your Primary Care Provider.    We will refer you to Genetics counseling and they will contact you with that appointment   I appreciate the  opportunity to care for you  Thank You   Harl Bowie , MD

## 2019-08-20 ENCOUNTER — Telehealth: Payer: Self-pay

## 2019-08-20 NOTE — Telephone Encounter (Signed)
Covid-19 screening questions   Do you now or have you had a fever in the last 14 days?  Do you have any respiratory symptoms of shortness of breath or cough now or in the last 14 days?  Do you have any family members or close contacts with diagnosed or suspected Covid-19 in the past 14 days?  Have you been tested for Covid-19 and found to be positive?       

## 2019-08-20 NOTE — Telephone Encounter (Signed)
Patient called back and answered "NO" to all screening questions. °

## 2019-08-21 ENCOUNTER — Encounter: Payer: Self-pay | Admitting: Gastroenterology

## 2019-08-21 ENCOUNTER — Ambulatory Visit (AMBULATORY_SURGERY_CENTER): Payer: Medicaid Other | Admitting: Gastroenterology

## 2019-08-21 ENCOUNTER — Other Ambulatory Visit: Payer: Self-pay

## 2019-08-21 VITALS — BP 119/73 | HR 72 | Temp 98.5°F | Resp 17 | Ht 67.0 in | Wt 222.0 lb

## 2019-08-21 DIAGNOSIS — K635 Polyp of colon: Secondary | ICD-10-CM | POA: Diagnosis not present

## 2019-08-21 DIAGNOSIS — D122 Benign neoplasm of ascending colon: Secondary | ICD-10-CM

## 2019-08-21 DIAGNOSIS — Z800 Family history of malignant neoplasm of digestive organs: Secondary | ICD-10-CM

## 2019-08-21 MED ORDER — SODIUM CHLORIDE 0.9 % IV SOLN: 500.0000 mL | Freq: Once | INTRAVENOUS | Status: DC

## 2019-08-21 NOTE — Op Note (Signed)
[facility] Patient Name: Sharon Thornton Procedure Date: 08/21/2019 1:35 PM MRN: [id number] Endoscopist: Mauri Pole , MD Age: 33 Referring MD:  Date of Birth: 1986/01/27 Gender: Female Account #: [id number] Procedure:                Colonoscopy Indications:              Screening in patient at increased risk: Colorectal                            cancer in father before age 43, in his 67's, Colon                            cancer screening in patient at increased risk:                            Family history of colorectal cancer in multiple 2nd                            degree relatives Medicines:                Monitored Anesthesia Care Procedure:                Pre-Anesthesia Assessment:                           - Prior to the procedure, a History and Physical                            was performed, and patient medications and                            allergies were reviewed. The patient's tolerance of                            previous anesthesia was also reviewed. The risks                            and benefits of the procedure and the sedation                            options and risks were discussed with the patient.                            All questions were answered, and informed consent                            was obtained. Prior Anticoagulants: The patient has                            taken no previous anticoagulant or antiplatelet                            agents. ASA Grade Assessment: II - A patient with  mild systemic disease. After reviewing the risks                            and benefits, the patient was deemed in                            satisfactory condition to undergo the procedure.                           After obtaining informed consent, the colonoscope                            was passed under direct vision. Throughout the                            procedure, the patient's blood pressure,  pulse, and                            oxygen saturations were monitored continuously. The                            Colonoscope was introduced through the anus and                            advanced to the the cecum, identified by                            appendiceal orifice and ileocecal valve. The                            colonoscopy was performed without difficulty. The                            patient tolerated the procedure well. The quality                            of the bowel preparation was adequate. The                            ileocecal valve, appendiceal orifice, and rectum                            were photographed. Scope In: 1:42:13 PM Scope Out: 1:59:17 PM Scope Withdrawal Time: 0 hours 12 minutes 15 seconds  Total Procedure Duration: 0 hours 17 minutes 4 seconds  Findings:                 The perianal and digital rectal examinations were                            normal.                           A 8 mm polyp was found in the ascending colon. The  polyp was sessile. The polyp was removed with a                            cold snare. Resection and retrieval were complete.                           Non-bleeding internal hemorrhoids were found during                            retroflexion. The hemorrhoids were small.                           The exam was otherwise without abnormality. Complications:            No immediate complications. Estimated Blood Loss:     Estimated blood loss was minimal. Impression:               - One 8 mm polyp in the ascending colon, removed                            with a cold snare. Resected and retrieved.                           - Non-bleeding internal hemorrhoids.                           - The examination was otherwise normal. Recommendation:           - Patient has a contact number available for                            emergencies. The signs and symptoms of potential                             delayed complications were discussed with the                            patient. Return to normal activities tomorrow.                            Written discharge instructions were provided to the                            patient.                           - Resume previous diet.                           - Continue present medications.                           - Await pathology results.                           - Repeat colonoscopy in 5 years for surveillance  based on pathology results. Mauri Pole, MD 08/21/2019 2:06:52 PM This report has been signed electronically.

## 2019-08-21 NOTE — Progress Notes (Signed)
Called to room to assist during endoscopic procedure.  Patient ID and intended procedure confirmed with present staff. Received instructions for my participation in the procedure from the performing physician.  

## 2019-08-21 NOTE — Progress Notes (Signed)
Temp by JB and vitals by NS. 

## 2019-08-21 NOTE — Progress Notes (Signed)
To PACU, VSS. Report to [email or web address].tb 

## 2019-08-21 NOTE — Patient Instructions (Signed)
Discharge instructions given. Handouts on polyps and Hemorrhoids. Resume previous medications. YOU HAD AN ENDOSCOPIC PROCEDURE TODAY AT THE [facility]:   Refer to the procedure report that was given to you for any specific questions about what was found during the examination.  If the procedure report does not answer your questions, please call your gastroenterologist to clarify.  If you requested that your care partner not be given the details of your procedure findings, then the procedure report has been included in a sealed envelope for you to review at your convenience later.  YOU SHOULD EXPECT: Some feelings of bloating in the abdomen. Passage of more gas than usual.  Walking can help get rid of the air that was put into your GI tract during the procedure and reduce the bloating. If you had a lower endoscopy (such as a colonoscopy or flexible sigmoidoscopy) you may notice spotting of blood in your stool or on the toilet paper. If you underwent a bowel prep for your procedure, you may not have a normal bowel movement for a few days.  Please Note:  You might notice some irritation and congestion in your nose or some drainage.  This is from the oxygen used during your procedure.  There is no need for concern and it should clear up in a day or so.  SYMPTOMS TO REPORT IMMEDIATELY:   Following lower endoscopy (colonoscopy or flexible sigmoidoscopy):  Excessive amounts of blood in the stool  Significant tenderness or worsening of abdominal pains  Swelling of the abdomen that is new, acute  Fever of 100F or higher   For urgent or emergent issues, a gastroenterologist can be reached at any hour by calling [phone].   DIET:  We do recommend a small meal at first, but then you may proceed to your regular diet.  Drink plenty of fluids but you should avoid alcoholic beverages for 24 hours.  ACTIVITY:  You should plan to take it easy for the rest of today and you should NOT DRIVE  or use heavy machinery until tomorrow (because of the sedation medicines used during the test).    FOLLOW UP: Our staff will call the number listed on your records 48-72 hours following your procedure to check on you and address any questions or concerns that you may have regarding the information given to you following your procedure. If we do not reach you, we will leave a message.  We will attempt to reach you two times.  During this call, we will ask if you have developed any symptoms of COVID 19. If you develop any symptoms (ie: fever, flu-like symptoms, shortness of breath, cough etc.) before then, please call [phone].  If you test positive for Covid 19 in the 2 weeks post procedure, please call and report this information to us.    If any biopsies were taken you will be contacted by phone or by letter within the next 1-3 weeks.  Please call us at [phone] if you have not heard about the biopsies in 3 weeks.    SIGNATURES/CONFIDENTIALITY: You and/or your care partner have signed paperwork which will be entered into your electronic medical record.  These signatures attest to the fact that that the information above on your After Visit Summary has been reviewed and is understood.  Full responsibility of the confidentiality of this discharge information lies with you and/or your care-partner. 

## 2019-08-25 ENCOUNTER — Telehealth: Payer: Self-pay | Admitting: *Deleted

## 2019-08-25 NOTE — Telephone Encounter (Signed)
1. Have you developed a fever since your procedure? no  2.   Have you had an respiratory symptoms (SOB or cough) since your procedure? no  3.   Have you tested positive for COVID 19 since your procedure no  4.   Have you had any family members/close contacts diagnosed with the COVID 19 since your procedure?  no   If yes to any of these questions please route to Joylene John, RN and Alphonsa Gin, Therapist, sports.     Follow up Call-  Call back number 08/21/2019  Post procedure Call Back phone  # [phone]  Permission to leave phone message Yes  Some recent data might be hidden     Patient questions:  Do you have a fever, pain , or abdominal swelling? No. Pain Score  0 *  Have you tolerated food without any problems? Yes.    Have you been able to return to your normal activities? Yes.    Do you have any questions about your discharge instructions: Diet   No. Medications  No. Follow up visit  No.  Do you have questions or concerns about your Care? No.  Actions: * If pain score is 4 or above: No action needed, pain <4.

## 2019-08-25 NOTE — Telephone Encounter (Signed)
Left message on f/u call 

## 2019-08-27 ENCOUNTER — Encounter: Payer: Self-pay | Admitting: Gastroenterology

## 2019-08-28 ENCOUNTER — Other Ambulatory Visit: Payer: Self-pay

## 2019-08-28 DIAGNOSIS — Z800 Family history of malignant neoplasm of digestive organs: Secondary | ICD-10-CM

## 2019-08-28 DIAGNOSIS — Z86010 Personal history of colonic polyps: Secondary | ICD-10-CM

## 2019-09-03 ENCOUNTER — Telehealth: Payer: Self-pay | Admitting: Genetic Counselor

## 2019-09-03 NOTE — Telephone Encounter (Signed)
Received a genetic counseling referral from Dr. Silverio Decamp for fhx of colon cancer and personal hx of colon polyps. Ms. Sharon Thornton returned my call and has been scheduled to see Clint Guy on [id number] at 3pm. Pt requested a late afternoon appt due to her kids school schedule. I offered a webex appt, but pt declined. She's been made aware to arrive 15 minutes early.

## 2019-09-17 ENCOUNTER — Inpatient Hospital Stay: Payer: Medicaid Other

## 2019-09-17 ENCOUNTER — Other Ambulatory Visit: Payer: Self-pay | Admitting: Genetic Counselor

## 2019-09-17 ENCOUNTER — Other Ambulatory Visit: Payer: Self-pay

## 2019-09-17 ENCOUNTER — Inpatient Hospital Stay: Payer: Medicaid Other | Attending: Genetic Counselor | Admitting: Genetic Counselor

## 2019-09-17 ENCOUNTER — Encounter: Payer: Self-pay | Admitting: Genetic Counselor

## 2019-09-17 DIAGNOSIS — Z800 Family history of malignant neoplasm of digestive organs: Secondary | ICD-10-CM | POA: Diagnosis not present

## 2019-09-17 DIAGNOSIS — Z8042 Family history of malignant neoplasm of prostate: Secondary | ICD-10-CM

## 2019-09-17 DIAGNOSIS — Z803 Family history of malignant neoplasm of breast: Secondary | ICD-10-CM | POA: Insufficient documentation

## 2019-09-17 NOTE — Progress Notes (Signed)
REFERRING PROVIDER: Mauri Pole, MD [facility],  [place] [id number]  PRIMARY PROVIDER:  Patient, No Pcp Per  PRIMARY REASON FOR VISIT:  1. Family history of colon cancer   2. Family history of breast cancer   3. Family history of prostate cancer      HISTORY OF PRESENT ILLNESS:   Sharon Thornton, a 33 y.o. female, was seen for a [place] cancer genetics consultation at the request of Dr. Silverio Decamp due to a family history of colon, breast, and prostate cancer.  Sharon Thornton presents to clinic today to discuss the possibility of a hereditary predisposition to cancer, genetic testing, and to further clarify her future cancer risks, as well as potential cancer risks for family members.   Sharon Thornton is a 33 y.o. female with no personal history of cancer.  She did have a colonoscopy recently that detected one sessile serrated adenoma.  CANCER HISTORY:  Oncology History   No history exists.     RISK FACTORS:  Menarche was at age 45.  First live birth at age 6.  OCP use for approximately 5-6 years.  Ovaries intact: yes.  Hysterectomy: no.  Menopausal status: premenopausal.  HRT use: 0 years. Colonoscopy: yes; one sessile serrated adenoma. Mammogram within the last year: no. Number of breast biopsies: 0. Any excessive radiation exposure in the past: no  Past Medical History:  Diagnosis Date  . Anemia   . Family history of breast cancer   . Family history of colon cancer   . Family history of prostate cancer   . No pertinent past medical history     Past Surgical History:  Procedure Laterality Date  . WISDOM TOOTH EXTRACTION    . WRIST SURGERY      Social History   Socioeconomic History  . Marital status: Married    Spouse name: Not on file  . Number of children: Not on file  . Years of education: Not on file  . Highest education level: Not on file  Occupational History  . Not on file  Social Needs  . Financial resource strain: Not on file  . Food  insecurity    Worry: Not on file    Inability: Not on file  . Transportation needs    Medical: Not on file    Non-medical: Not on file  Tobacco Use  . Smoking status: Never Smoker  . Smokeless tobacco: Never Used  Substance and Sexual Activity  . Alcohol use: Yes    Comment: rarely  . Drug use: No  . Sexual activity: Yes    Birth control/protection: I.U.D.  Lifestyle  . Physical activity    Days per week: Not on file    Minutes per session: Not on file  . Stress: Not on file  Relationships  . Social Herbalist on phone: Not on file    Gets together: Not on file    Attends religious service: Not on file    Active member of club or organization: Not on file    Attends meetings of clubs or organizations: Not on file    Relationship status: Not on file  Other Topics Concern  . Not on file  Social History Narrative  . Not on file     FAMILY HISTORY:  We obtained a detailed, 4-generation family history.  Significant diagnoses are listed below: Family History  Problem Relation Age of Onset  . Heart disease Paternal Uncle   . Diabetes Maternal  Grandmother   . Hypertension Maternal Grandmother   . Diabetes Maternal Grandfather   . Heart disease Maternal Grandfather   . Hypertension Maternal Grandfather   . Diabetes Paternal Grandmother   . Colon cancer Paternal Grandmother 78  . Breast cancer Paternal Grandmother        diagnosed in her 36s or 7s  . Colon cancer Father 60       stage 3 at dignosis   Sharon Thornton has three children - one son who is 64, and two daughters who are 6 and 19 months. She has one brother who is 81 and does not have children.  Sharon Thornton mother is 41 and does not have a history of cancer. She has two maternal uncles and one maternal aunt who have not had cancer. Her maternal grandparents died in their 45s without cancer. There are no known diagnoses of cancer on the maternal side of the family.  Sharon Thornton's father died at the age of 28  due to colon cancer that was diagnosed when he was 106. Upon examination, her father also had colon polyps, but she is unsure of how many he had. Sharon Thornton has two paternal uncles, one who died from heart problems in his 61s, and one who is currently living at age 58. Her paternal grandmother had colon cancer that was diagnosed in her 67s or 62s, and bilateral breast cancer that was diagnosed in her 66s and 72s. Her paternal grandfather is currently living in his 76s, and has a history of prostate cancer in his 59s or 67s.   Sharon Thornton is unaware of previous family history of genetic testing for hereditary cancer risks. Patient's maternal ancestors are of [place] descent, and paternal ancestors are of [place], [place], and [place] descent. There is no reported Ashkenazi Jewish ancestry. There is no known consanguinity.  GENETIC COUNSELING ASSESSMENT: Sharon Thornton is a 33 y.o. female with a family history of colon, breast, and prostate cancer, which is somewhat suggestive of a hereditary cancer syndrome and predisposition to cancer. We, therefore, discussed and recommended the following at today's visit.   DISCUSSION: We discussed that 5 - 10% of cancer is hereditary. Most cases of hereditary colorectal cancer is associated with Lynch syndrome.  Lynch syndrome is a genetic condition that increases the risk for colorectal and endometrial cancers, among other cancer types.  There are other genes that can be associated with hereditary colon cancer syndromes, particularly those that cause polyposis.  We discussed that polyps in general are common, however, most people have fewer than 5 lifetime polyps.  When an individual has 10 or more polyps we become concerned about an underlying polyposis syndrome.  The most common hereditary polyposis syndromes are caused by problems in the APC and MUTYH genes, however, more recently, mutations in the [place] and MSH3 genes have been identified in some polyposis families.   We also  discussed that breast cancer, similar to colon cancer, can run through the family and have an underlying hereditary cause.  We discussed that the family history is not highly suggestive of a hereditary breast cancer syndrome due to her paternal grandmother's later age of onset. However, there are limitations in interpreting her family history since all other paternal relatives are female.  We discussed that testing and identifying a hereditary cancer syndrome is beneficial for several reasons including knowing about other cancer risks, identifying potential screening and risk-reduction options that may be appropriate, and to understand if other family members could be at  risk for cancer and allow them to undergo genetic testing.  We reviewed the characteristics, features and inheritance patterns of hereditary cancer syndromes. We also discussed genetic testing, including the appropriate family members to test, the process of testing, insurance coverage and turn-around-time for results. We discussed the implications of a negative, positive and/or variant of uncertain significant result. We recommended Sharon Thornton pursue genetic testing for the [employer].   The CustomNext-Cancer+RNAinsight panel offered by [facility] includes sequencing and rearrangement analysis for the following 91 genes: AIP, ALK, APC*, ATM*, AXIN2, BAP1, BARD1, BLM, BMPR1A, BRCA1*, BRCA2*, BRIP1*, CDC73, CDH1*, CDK4, CDKN1B, CDKN2A, CHEK2*, CTNNA1, DICER1, FANCC, FH, FLCN, GALNT12, KIF1B, LZTR1, MAX, MEN1, MET, MLH1*, MRE11A, MSH2*, MSH3, MSH6*, MUTYH*, NBN, NF1*, NF2, NTHL1, PALB2*, PHOX2B, PMS2*, POT1, PRKAR1A, PTCH1, PTEN*, RAD50, RAD51C*, RAD51D*, RB1, RECQL, RET, SDHA, SDHAF2, SDHB, SDHC, SDHD, SMAD4, SMARCA4, SMARCB1, SMARCE1, STK11, SUFU, TMEM127, TP53*, TSC1, TSC2, VHL and XRCC2 (sequencing and deletion/duplication); CASR, CFTR, CPA1, CTRC, EGFR, EGLN1, FAM175A, HOXB13, KIT, MITF, MLH3, PALLD, PDGFRA,  POLD1, POLE, PRSS1, RINT1, RPS20, SPINK1 and TERT (sequencing only); EPCAM and GREM1 (deletion/duplication only). DNA and RNA analyses performed for * genes.   Based on Sharon Thornton's family history of cancer, she meets medical criteria for genetic testing. Despite that she meets criteria, she may still have an out of pocket cost. We discussed that if her out of pocket cost for testing is over $100, the laboratory will call and confirm whether she wants to proceed with testing.  If the out of pocket cost of testing is less than $100 she will be billed by the genetic testing laboratory.   We discussed that some people do not want to undergo genetic testing due to fear of genetic discrimination.  A federal law called the Genetic Information Non-Discrimination Act (GINA) of 2008 helps protect individuals against genetic discrimination based on their genetic test results.  It impacts both health insurance and employment.  With health insurance, it protects against increased premiums, being kicked off insurance or being forced to take a test in order to be insured.  For employment it protects against hiring, firing and promoting decisions based on genetic test results.  Health status due to a cancer diagnosis is not protected under GINA.  Additionally, life, disability, and long-term care insurance is not protected under GINA.   PLAN: After considering the risks, benefits, and limitations, Sharon Thornton provided informed consent to pursue genetic testing and the blood sample was sent to [facility] for analysis of the CustomNext-Cancer+RNAinsight panel. Results should be available within approximately two-three weeks' time, at which point they will be disclosed by telephone to Sharon Thornton, as will any additional recommendations warranted by these results. Sharon Thornton will receive a summary of her genetic counseling visit and a copy of her results once available. This information will also be available in Epic.   Ms.  Thornton questions were answered to her satisfaction today. Our contact information was provided should additional questions or concerns arise. Thank you for the referral and allowing [place] to share in the care of your patient.   Clint Guy, MS, [facility] Certified Genetic Counselor [email or web address] Phone: [phone]  The patient was seen for a total of 40 minutes in face-to-face genetic counseling.  This patient was discussed with Drs. Magrinat, Lindi Adie and/or Burr Medico who agrees with the above.    _______________________________________________________________________ For Office Staff:  Number of people involved in session: 1 Was an Intern/ student involved with case: no

## 2019-10-06 ENCOUNTER — Telehealth: Payer: Self-pay | Admitting: Genetic Counselor

## 2019-10-06 NOTE — Telephone Encounter (Signed)
Ms. Sharon Thornton called to check on the status of her genetic test results. They are not yet available. The genetic testing laboratory estimates that the results will be available by 11/28. I will call her once those results are available.

## 2019-10-13 DIAGNOSIS — Z1379 Encounter for other screening for genetic and chromosomal anomalies: Secondary | ICD-10-CM | POA: Insufficient documentation

## 2019-10-13 NOTE — Telephone Encounter (Signed)
Ms. Edling called again to check on the status of her genetic test results. Unfortunately the results are still not available. An update from the genetic testing laboratory estimates that the results will be ready within the next three days. I will call her once those results are available.

## 2019-10-14 ENCOUNTER — Telehealth: Payer: Self-pay | Admitting: Genetic Counselor

## 2019-10-14 ENCOUNTER — Ambulatory Visit: Payer: Self-pay | Admitting: Genetic Counselor

## 2019-10-14 DIAGNOSIS — Z1379 Encounter for other screening for genetic and chromosomal anomalies: Secondary | ICD-10-CM

## 2019-10-14 NOTE — Telephone Encounter (Addendum)
Disclosed genetic test results. Overall, the genetic test did not find an explanation for the cancer in the family. It is possible that there is a hereditary cause for cancer in the family that Ms. Underberg did not inherit. It could also be due to a different gene that we are not testing, or our current technology may not be able to detect something. It will be important for her to follow-up with her GI doctors and have cancer screening as recommended.   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 We discussed conditions related to the CFTR gene, including cystic fibrosis, female infertility, and chronic pancreatitis. Her particular mutation is less likely to be associated with cystic fibrosis and female infertility when paired with an additional classic CFTR mutation. Additionally, it is unclear what the association is between her particular mutation and pancreatitis, if any.   Her genetic test also detected two variants of uncertain significance - one in the AIP gene called c.922G>A, and one in the MRE11A gene called c.1926+4A>G. These variants should not impact her medical management.

## 2019-10-14 NOTE — Progress Notes (Signed)
GENETIC TEST RESULTS   Patient Name: Sharon Thornton Patient Age: 33 y.o. Encounter Date: 10/14/2019  Referring Provider: Mauri Pole, MD [place],  [place] [address]   HPI:  Sharon Thornton was previously seen in the [facility] due to a family history of colon, breast, and prostate cancer, and concerns regarding a hereditary predisposition to cancer. Please refer to our prior cancer genetics clinic note for more information regarding our discussion, assessment and recommendations, at the time. Sharon Thornton recent genetic test results were disclosed to her, as were recommendations warranted by these results. These results and recommendations are discussed in more detail below.  CANCER HISTORY:  Oncology History   No history exists.    FAMILY HISTORY:  We obtained a detailed, 4-generation family history.  Significant diagnoses are listed below: Family History  Problem Relation Age of Onset   Heart disease Paternal Uncle    Diabetes Maternal Grandmother    Hypertension Maternal Grandmother    Diabetes Maternal Grandfather    Heart disease Maternal Grandfather    Hypertension Maternal Grandfather    Diabetes Paternal Grandmother    Colon cancer Paternal Grandmother 34   Breast cancer Paternal Grandmother        diagnosed in her 19s or 48s   Colon cancer Father 16       stage 3 at dignosis   Sharon Thornton has three children - one son who is 66, and two daughters who are 6 and 19 months. She has one brother who is 33 and does not have children.  Sharon Thornton mother is 49 and does not have a history of cancer. She has two maternal uncles and one maternal aunt who have not had cancer. Her maternal grandparents died in their 2s without cancer. There are no known diagnoses of cancer on the maternal side of the family.  Sharon Thornton's father died at the age of 87 due to colon cancer that was diagnosed when he was 32. Upon examination, her father  also had colon polyps, but she is unsure of how many he had. Sharon Thornton has two paternal uncles, one who died from heart problems in his 36s, and one who is currently living at age 68. Her paternal grandmother had colon cancer that was diagnosed in her 4s or 21s, and bilateral breast cancer that was diagnosed in her 66s and 30s. Her paternal grandfather is currently living in his 48s, and has a history of prostate cancer in his 36s or 52s.   Sharon Thornton is unaware of previous family history of genetic testing for hereditary cancer risks. Patient's maternal ancestors are of [place] descent, and paternal ancestors are of [place], [place], and [place] descent. There is no reported Ashkenazi Jewish ancestry. There is no known consanguinity.  GENETIC TEST RESULTS: At the time of Sharon Thornton's visit, we recommended she pursue genetic testing of the CustomNext-Cancer+RNAinsight gene panel. This test was performed at [facility]. The CustomNext-Cancer+RNAinsight panel offered by [facility] includes sequencing and rearrangement analysis for the following 91 genes: AIP, ALK, APC*, ATM*, AXIN2, BAP1, BARD1, BLM, BMPR1A, BRCA1*, BRCA2*, BRIP1*, CDC73, CDH1*, CDK4, CDKN1B, CDKN2A, CHEK2*, CTNNA1, DICER1, FANCC, FH, FLCN, GALNT12, KIF1B, LZTR1, MAX, MEN1, MET, MLH1*, MRE11A, MSH2*, MSH3, MSH6*, MUTYH*, NBN, NF1*, NF2, NTHL1, PALB2*, PHOX2B, PMS2*, POT1, PRKAR1A, PTCH1, PTEN*, RAD50, RAD51C*, RAD51D*, RB1, RECQL, RET, SDHA, SDHAF2, SDHB, SDHC, SDHD, SMAD4, SMARCA4, SMARCB1, SMARCE1, STK11, SUFU, TMEM127, TP53*, TSC1, TSC2, VHL and XRCC2 (sequencing and deletion/duplication); CASR,  CFTR, CPA1, CTRC, EGFR, EGLN1, FAM175A, HOXB13, KIT, MITF, MLH3, PALLD, PDGFRA, POLD1, POLE, PRSS1, RINT1, RPS20, SPINK1 and TERT (sequencing only); EPCAM and GREM1 (deletion/duplication only). DNA and RNA analyses performed for * genes.  Sharon Thornton was called today with her genetic test results.   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  Since Sharon Thornton has only one mutation in CFTR, she is NOT affected with cystic fibrosis or CFTR-related disorders, but instead is a carrier.   A copy of the test report will be scanned into Epic under the Molecular Pathology section of the Results Review tab.     Sharon Thornton result does not explain her family history of cancer.  We discussed with Sharon Thornton that because current genetic testing is not perfect, it is possible there may be a gene mutation in one of these genes that current testing cannot detect, but that chance is small.  We also discussed that there could be another gene that has not yet been discovered, or that we have not yet tested, that is responsible for the cancer diagnoses in the family. It is also possible there is a hereditary cause for the cancer in the family that Sharon Thornton did not inherit and therefore was not identified in her testing.  Therefore, it is important to remain in touch with cancer genetics in the future so that we can continue to offer Sharon Thornton the most up to date genetic testing.   Genetic testing did identify two variants of uncertain significance (VUS) - one in the AIP gene called c.922G>A and a second in the MRE11A gene called 1926+4A>G.  At this time, it is unknown if these variants are associated with increased cancer risk or if they are normal findings, but most variants such as these get reclassified to being inconsequential. They should not be used to make medical management decisions. With time, we suspect the lab will determine the significance of these variants, if any. If we do learn more about them, we will try to contact Sharon Thornton to discuss it further. However, it is important to stay in touch with [place] periodically and keep the address and phone number up to date.  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   We discussed that having two mutations in the CFTR gene can result in cystic fibrosis, an autosomal recessive condition that classically affects the lungs, pancreas, and digestive tract. In addition to classic cystic fibrosis, there are variant types of cystic fibrosis, called CFTR-related disorders (CFTR-RD), which have less severe symptoms including milder respiratory problems, female infertility, and/or acute recurrent or chronic pancreatitis. In some males, infertility caused by congenital bilateral absence of the vas deferens (CBAVD) may be the only clinical finding. Due to the autosomal recessive inheritance, two pathogenic variants (one on each chromosome) are required to cause disease.  Because Ms. Goers only has one pathogenic variant, she is not affected with a CFTR-related disorder, but is instead a carrier.  Ms. Sheriff's CFTR variant, (TG)11-5T, is known to have reduced penetrance, meaning it does not always cause disease when paired with another mutation. There are multiple forms of the 5T variant, which are classified by the number of TG repeats. The risk for chronic pancreatitis can be increased in CFTR carriers, although it is unknown if this risk is increased by the  reduced-penetrance mutation that was detected in Ms. Ludwick.   We also discussed how this result may impact her children. If the father of her children is also a carrier of a pathogenic variant in the CFTR gene, this may increase the risk of CBAVD in female children.   CANCER SCREENING RECOMMENDATIONS: Based on Ms. Pickard's genetic test result, we have not identified a hereditary cause for her family history of cancer at this time. While reassuring, this does not definitively rule out a hereditary predisposition to cancer. It is still possible that there could be genetic mutations that are undetectable by current technology. There could be genetic mutations in genes that have  not been tested or identified to increase cancer risk.  Therefore, it is recommended she continue to follow the cancer management and screening guidelines provided by her primary healthcare provider and GI doctors.   An individual's cancer risk and medical management are not determined by genetic test results alone. Overall cancer risk assessment incorporates additional factors, including personal medical history, family history, and any available genetic information that may result in a personalized plan for cancer prevention and surveillance.  Based on Ms. Digirolamo's family of cancer, as well as her genetic test results, a statistical model Midwife) was used to estimate her risk of developing breast cancer. This estimates her lifetime risk of developing breast cancer to be approximately 18.7%. Her lifetime breast cancer risk is a preliminary estimate based on available information using one of several models endorsed by the [place] (ACS). The ACS recommends consideration of breast MRI screening as an adjunct to mammography for patients at high risk (defined as 20% or greater lifetime risk). A more detailed breast cancer risk assessment can be considered, if clinically indicated.      RECOMMENDATIONS FOR FAMILY MEMBERS:  Individuals in this family might be at some increased risk of developing cancer, over the general population risk, simply due to the family history of cancer.  We recommended women in this family have a yearly mammogram beginning at age 63, or 94 years younger than the earliest onset of cancer, an annual clinical breast exam, and perform monthly breast self-exams. Women in this family should also have a gynecological exam as recommended by their primary provider. All family members should have a colonoscopy by age 18.  FOLLOW-UP: Lastly, we discussed with Ms. Kosinski that cancer genetics is a rapidly advancing field and it is possible that new genetic tests will be  appropriate for her and/or her family members in the future. We encouraged her to remain in contact with cancer genetics on an annual basis so we can update her personal and family histories and let her know of advances in cancer genetics that may benefit this family.   Our contact number was provided. Ms. Peterkin questions were answered to her satisfaction, and she knows she is welcome to call us at anytime with additional questions or concerns.   Clint Guy, MS, [facility] Certified Genetic Counselor [email or web address] Phone: [phone]

## 2019-10-23 NOTE — Progress Notes (Signed)
Thank you :)

## 2020-07-25 ENCOUNTER — Other Ambulatory Visit: Payer: Self-pay

## 2020-07-25 ENCOUNTER — Other Ambulatory Visit: Payer: Medicaid Other

## 2020-07-25 DIAGNOSIS — Z20822 Contact with and (suspected) exposure to covid-19: Secondary | ICD-10-CM

## 2020-07-26 LAB — NOVEL CORONAVIRUS, NAA: SARS-CoV-2, NAA: NOT DETECTED

## 2020-07-26 LAB — SARS-COV-2, NAA 2 DAY TAT

## 2021-06-15 ENCOUNTER — Other Ambulatory Visit: Payer: Self-pay | Admitting: Obstetrics & Gynecology

## 2021-07-04 ENCOUNTER — Other Ambulatory Visit: Payer: Self-pay

## 2021-07-04 ENCOUNTER — Encounter (HOSPITAL_COMMUNITY)
Admission: RE | Admit: 2021-07-04 | Discharge: 2021-07-04 | Disposition: A | Discharge status: Home / Self Care | Payer: Medicaid Other | Source: Ambulatory Visit | Attending: Obstetrics & Gynecology | Admitting: Obstetrics & Gynecology

## 2021-07-04 ENCOUNTER — Encounter (HOSPITAL_COMMUNITY): Payer: Self-pay

## 2021-07-04 DIAGNOSIS — Z01812 Encounter for preprocedural laboratory examination: Secondary | ICD-10-CM | POA: Insufficient documentation

## 2021-07-04 LAB — CBC
HCT: 41.3 % (ref 36.0–46.0)
Hemoglobin: 13.7 g/dL (ref 12.0–15.0)
MCH: 28.1 pg (ref 26.0–34.0)
MCHC: 33.2 g/dL (ref 30.0–36.0)
MCV: 84.6 fL (ref 80.0–100.0)
Platelets: 282 10*3/uL (ref 150–400)
RBC: 4.88 MIL/uL (ref 3.87–5.11)
RDW: 13.4 % (ref 11.5–15.5)
WBC: 7.4 10*3/uL (ref 4.0–10.5)
nRBC: 0 % (ref 0.0–0.2)

## 2021-07-04 NOTE — Pre-Procedure Instructions (Signed)
Surgical Instructions    Your procedure is scheduled on Monday, August 29th.  Report to [facility] Main Entrance "A" at 11:00 A.M., then check in with the Admitting office.  Call this number if you have problems the morning of surgery:  [phone]   If you have any questions prior to your surgery date call [phone]: Open Monday-Friday 8am-4pm    Remember:  Do not eat after midnight the night before your surgery  You may drink clear liquids until 10:00 a.m. the morning of your surgery.   Clear liquids allowed are: Water, Non-Citrus Juices (without pulp), Carbonated Beverages, Clear Tea, Black Coffee Only, and Gatorade.    Take these medicines AS NEEDED the morning of surgery with A SIP OF WATER  acetaminophen (TYLENOL)  traMADol (ULTRAM)  As of today, STOP taking any Aspirin (unless otherwise instructed by your surgeon) Aleve, Naproxen, Ibuprofen, Motrin, Advil, Goody's, BC's, all herbal medications, fish oil, and all vitamins.                     Do NOT Smoke (Tobacco/Vaping) or drink Alcohol 24 hours prior to your procedure.  If you use a CPAP at night, you may bring all equipment for your overnight stay.   Contacts, glasses, piercing's, hearing aid's, dentures or partials may not be worn into surgery, please bring cases for these belongings.    For patients admitted to the hospital, discharge time will be determined by your treatment team.   Patients discharged the day of surgery will not be allowed to drive home, and someone needs to stay with them for 24 hours.  ONLY 1 SUPPORT PERSON MAY BE PRESENT WHILE YOU ARE IN SURGERY. IF YOU ARE TO BE ADMITTED ONCE YOU ARE IN YOUR ROOM YOU WILL BE ALLOWED TWO (2) VISITORS.  Minor children may have two parents present. Special consideration for safety and communication needs will be reviewed on a case by case basis.   Special instructions:   [facility]- Preparing For Surgery  Before surgery, you can play an important role.  Because skin is not sterile, your skin needs to be as free of germs as possible. You can reduce the number of germs on your skin by washing with CHG (chlorahexidine gluconate) Soap before surgery.  CHG is an antiseptic cleaner which kills germs and bonds with the skin to continue killing germs even after washing.    Oral Hygiene is also important to reduce your risk of infection.  Remember - BRUSH YOUR TEETH THE MORNING OF SURGERY WITH YOUR REGULAR TOOTHPASTE  Please do not use if you have an allergy to CHG or antibacterial soaps. If your skin becomes reddened/irritated stop using the CHG.  Do not shave (including legs and underarms) for at least 48 hours prior to first CHG shower. It is OK to shave your face.  Please follow these instructions carefully.   Shower the NIGHT BEFORE SURGERY and the MORNING OF SURGERY  If you chose to wash your hair, wash your hair first as usual with your normal shampoo.  After you shampoo, rinse your hair and body thoroughly to remove the shampoo.  Use CHG Soap as you would any other liquid soap. You can apply CHG directly to the skin and wash gently with a scrungie or a clean washcloth.   Apply the CHG Soap to your body ONLY FROM THE NECK DOWN.  Do not use on open wounds or open sores. Avoid contact with your eyes, ears, mouth and genitals (  private parts). Wash Face and genitals (private parts)  with your normal soap.   Wash thoroughly, paying special attention to the area where your surgery will be performed.  Thoroughly rinse your body with warm water from the neck down.  DO NOT shower/wash with your normal soap after using and rinsing off the CHG Soap.  Pat yourself dry with a CLEAN TOWEL.  Wear CLEAN PAJAMAS to bed the night before surgery  Place CLEAN SHEETS on your bed the night before your surgery  DO NOT SLEEP WITH PETS.   Day of Surgery: Shower with CHG soap. Do not wear jewelry, make up, nail polish, gel polish, artificial nails, or any  other type of covering on natural nails including finger and toenails. If patients have artificial nails, gel coating, etc. that need to be removed by a nail salon please have this removed prior to surgery. Surgery may need to be canceled/delayed if the surgeon/ anesthesia feels like the patient is unable to be adequately monitored. Do not wear lotions, powders, perfumes, or deodorant. Do not shave 48 hours prior to surgery.   Do not bring valuables to the hospital. [facility] is not responsible for any belongings or valuables. Wear Clean/Comfortable clothing the morning of surgery Remember to brush your teeth WITH YOUR REGULAR TOOTHPASTE.   Please read over the following fact sheets that you were given.

## 2021-07-04 NOTE — Progress Notes (Signed)
PCP - no PCP per patient Cardiologist - n/a  PPM/ICD - n/a Device Orders -  Rep Notified -   Chest x-ray - n/a EKG - n/a Stress Test - patient denies ECHO - patient denies Cardiac Cath - patient denies  Sleep Study - n/a CPAP -   Fasting Blood Sugar -  n/a Checks Blood Sugar _____ times a day  Blood Thinner Instructions: n/a Aspirin Instructions: n/a  ERAS Protcol - clears until 10:00am PRE-SURGERY Ensure or G2- none ordered  COVID TEST- n/a, ambulatory - patient instructed to wear a mask when in public.   Anesthesia review: n/a  Patient denies shortness of breath, fever, cough and chest pain at PAT appointment   All instructions explained to the patient, with a verbal understanding of the material. Patient agrees to go over the instructions while at home for a better understanding. Patient also instructed to self quarantine after being tested for COVID-19. The opportunity to ask questions was provided.

## 2021-07-10 ENCOUNTER — Ambulatory Visit (HOSPITAL_COMMUNITY)
Admission: RE | Admit: 2021-07-10 | Discharge: 2021-07-10 | Disposition: A | Discharge status: Home / Self Care | Payer: Medicaid Other | Attending: Obstetrics & Gynecology | Admitting: Obstetrics & Gynecology

## 2021-07-10 ENCOUNTER — Ambulatory Visit (HOSPITAL_COMMUNITY): Payer: Medicaid Other | Admitting: Anesthesiology

## 2021-07-10 ENCOUNTER — Encounter (HOSPITAL_COMMUNITY): Payer: Self-pay | Admitting: Obstetrics & Gynecology

## 2021-07-10 ENCOUNTER — Other Ambulatory Visit: Payer: Self-pay

## 2021-07-10 ENCOUNTER — Encounter (HOSPITAL_COMMUNITY)
Admission: RE | Disposition: A | Discharge status: Home / Self Care | Payer: Self-pay | Source: Home / Self Care | Attending: Obstetrics & Gynecology

## 2021-07-10 DIAGNOSIS — T8332XA Displacement of intrauterine contraceptive device, initial encounter: Secondary | ICD-10-CM | POA: Insufficient documentation

## 2021-07-10 DIAGNOSIS — X58XXXA Exposure to other specified factors, initial encounter: Secondary | ICD-10-CM | POA: Insufficient documentation

## 2021-07-10 DIAGNOSIS — R102 Pelvic and perineal pain: Secondary | ICD-10-CM | POA: Diagnosis not present

## 2021-07-10 DIAGNOSIS — T8332XS Displacement of intrauterine contraceptive device, sequela: Secondary | ICD-10-CM

## 2021-07-10 DIAGNOSIS — Z793 Long term (current) use of hormonal contraceptives: Secondary | ICD-10-CM | POA: Diagnosis not present

## 2021-07-10 HISTORY — PX: IUD REMOVAL: SHX5392

## 2021-07-10 HISTORY — PX: HYSTEROSCOPY: SHX211

## 2021-07-10 LAB — POCT PREGNANCY, URINE: Preg Test, Ur: NEGATIVE

## 2021-07-10 SURGERY — HYSTEROSCOPY
Anesthesia: General

## 2021-07-10 MED ORDER — ROCURONIUM BROMIDE 10 MG/ML (PF) SYRINGE
PREFILLED_SYRINGE | INTRAVENOUS | Status: AC
  Filled 2021-07-10: qty 10

## 2021-07-10 MED ORDER — LIDOCAINE HCL (PF) 1 % IJ SOLN
INTRAMUSCULAR | Status: AC
  Filled 2021-07-10: qty 30

## 2021-07-10 MED ORDER — FENTANYL CITRATE (PF) 100 MCG/2ML IJ SOLN
25.0000 ug | INTRAMUSCULAR | Status: DC | PRN
  Administered 2021-07-10: 50 ug via INTRAVENOUS

## 2021-07-10 MED ORDER — CEFAZOLIN SODIUM-DEXTROSE 2-4 GM/100ML-% IV SOLN
2.0000 g | INTRAVENOUS | Status: AC
  Administered 2021-07-10: 2 g via INTRAVENOUS
  Filled 2021-07-10: qty 100

## 2021-07-10 MED ORDER — DEXAMETHASONE SODIUM PHOSPHATE 10 MG/ML IJ SOLN
INTRAMUSCULAR | Status: DC | PRN
  Administered 2021-07-10: 10 mg via INTRAVENOUS

## 2021-07-10 MED ORDER — FENTANYL CITRATE (PF) 100 MCG/2ML IJ SOLN
INTRAMUSCULAR | Status: DC | PRN
  Administered 2021-07-10: 100 ug via INTRAVENOUS

## 2021-07-10 MED ORDER — ACETAMINOPHEN 500 MG PO TABS
1000.0000 mg | ORAL_TABLET | Freq: Once | ORAL | Status: AC
  Administered 2021-07-10: 1000 mg via ORAL
  Filled 2021-07-10: qty 2

## 2021-07-10 MED ORDER — LACTATED RINGERS IV SOLN: INTRAVENOUS | Status: DC

## 2021-07-10 MED ORDER — IBUPROFEN 600 MG PO TABS: ORAL_TABLET | ORAL | 0 refills | Status: DC

## 2021-07-10 MED ORDER — ONDANSETRON HCL 4 MG/2ML IJ SOLN
INTRAMUSCULAR | Status: DC | PRN
  Administered 2021-07-10: 4 mg via INTRAVENOUS

## 2021-07-10 MED ORDER — LIDOCAINE HCL 1 % IJ SOLN
INTRAMUSCULAR | Status: DC | PRN
  Administered 2021-07-10: 30 mL

## 2021-07-10 MED ORDER — MIDAZOLAM HCL 2 MG/2ML IJ SOLN
INTRAMUSCULAR | Status: AC
  Filled 2021-07-10: qty 2

## 2021-07-10 MED ORDER — CHLORHEXIDINE GLUCONATE 0.12 % MT SOLN
15.0000 mL | Freq: Once | OROMUCOSAL | Status: AC
  Administered 2021-07-10: 15 mL via OROMUCOSAL
  Filled 2021-07-10: qty 15

## 2021-07-10 MED ORDER — OXYCODONE-ACETAMINOPHEN 5-325 MG PO TABS: ORAL_TABLET | ORAL | 0 refills | Status: DC

## 2021-07-10 MED ORDER — FENTANYL CITRATE (PF) 100 MCG/2ML IJ SOLN
INTRAMUSCULAR | Status: AC
  Filled 2021-07-10: qty 2

## 2021-07-10 MED ORDER — ROCURONIUM BROMIDE 10 MG/ML (PF) SYRINGE
PREFILLED_SYRINGE | INTRAVENOUS | Status: DC | PRN
  Administered 2021-07-10: 50 mg via INTRAVENOUS

## 2021-07-10 MED ORDER — MIDAZOLAM HCL 5 MG/5ML IJ SOLN
INTRAMUSCULAR | Status: DC | PRN
  Administered 2021-07-10: 2 mg via INTRAVENOUS

## 2021-07-10 MED ORDER — PROPOFOL 10 MG/ML IV BOLUS
INTRAVENOUS | Status: DC | PRN
  Administered 2021-07-10: 180 mg via INTRAVENOUS

## 2021-07-10 MED ORDER — LIDOCAINE-EPINEPHRINE 1 %-1:100000 IJ SOLN
INTRAMUSCULAR | Status: AC
  Filled 2021-07-10: qty 1

## 2021-07-10 MED ORDER — LIDOCAINE 2% (20 MG/ML) 5 ML SYRINGE
INTRAMUSCULAR | Status: DC | PRN
  Administered 2021-07-10: 100 mg via INTRAVENOUS

## 2021-07-10 MED ORDER — KETOROLAC TROMETHAMINE 30 MG/ML IJ SOLN
INTRAMUSCULAR | Status: DC | PRN
  Administered 2021-07-10: 30 mg via INTRAVENOUS

## 2021-07-10 MED ORDER — DEXAMETHASONE SODIUM PHOSPHATE 10 MG/ML IJ SOLN
INTRAMUSCULAR | Status: AC
  Filled 2021-07-10: qty 1

## 2021-07-10 MED ORDER — PROMETHAZINE HCL 25 MG/ML IJ SOLN: 6.2500 mg | INTRAMUSCULAR | Status: DC | PRN

## 2021-07-10 MED ORDER — BUPIVACAINE HCL (PF) 0.25 % IJ SOLN
INTRAMUSCULAR | Status: AC
  Filled 2021-07-10: qty 10

## 2021-07-10 MED ORDER — SUGAMMADEX SODIUM 200 MG/2ML IV SOLN
INTRAVENOUS | Status: DC | PRN
  Administered 2021-07-10: 200 mg via INTRAVENOUS

## 2021-07-10 MED ORDER — SILVER NITRATE-POT NITRATE 75-25 % EX MISC
CUTANEOUS | Status: DC | PRN
  Administered 2021-07-10: 2

## 2021-07-10 MED ORDER — EPHEDRINE SULFATE-NACL 50-0.9 MG/10ML-% IV SOSY
PREFILLED_SYRINGE | INTRAVENOUS | Status: DC | PRN
  Administered 2021-07-10: 5 mg via INTRAVENOUS

## 2021-07-10 MED ORDER — PROPOFOL 10 MG/ML IV BOLUS
INTRAVENOUS | Status: AC
  Filled 2021-07-10: qty 20

## 2021-07-10 MED ORDER — FENTANYL CITRATE (PF) 250 MCG/5ML IJ SOLN
INTRAMUSCULAR | Status: AC
  Filled 2021-07-10: qty 5

## 2021-07-10 MED ORDER — SODIUM CHLORIDE 0.9 % IR SOLN
Status: DC | PRN
  Administered 2021-07-10: 1000 mL

## 2021-07-10 MED ORDER — LIDOCAINE 2% (20 MG/ML) 5 ML SYRINGE
INTRAMUSCULAR | Status: AC
  Filled 2021-07-10: qty 5

## 2021-07-10 MED ORDER — POVIDONE-IODINE 10 % EX SWAB
2.0000 "application " | Freq: Once | CUTANEOUS | Status: AC
  Administered 2021-07-10: 2 via TOPICAL

## 2021-07-10 MED ORDER — ORAL CARE MOUTH RINSE: 15.0000 mL | Freq: Once | OROMUCOSAL | Status: AC

## 2021-07-10 SURGICAL SUPPLY — 22 items
APL PRP STRL LF DISP 70% ISPRP (MISCELLANEOUS) ×2
APL SRG 38 LTWT LNG FL B (MISCELLANEOUS)
APPLICATOR ARISTA FLEXITIP XL (MISCELLANEOUS) IMPLANT
CHLORAPREP W/TINT 26 (MISCELLANEOUS) ×4 IMPLANT
DRSG OPSITE POSTOP 3X4 (GAUZE/BANDAGES/DRESSINGS) IMPLANT
ELECT REM PT RETURN 9FT ADLT (ELECTROSURGICAL)
ELECTRODE REM PT RTRN 9FT ADLT (ELECTROSURGICAL) IMPLANT
GLOVE SURG ENC MOIS LTX SZ6.5 (GLOVE) ×4 IMPLANT
GLOVE SURG UNDER POLY LF SZ7 (GLOVE) ×16 IMPLANT
GOWN STRL REUS W/ TWL LRG LVL3 (GOWN DISPOSABLE) ×4 IMPLANT
GOWN STRL REUS W/TWL LRG LVL3 (GOWN DISPOSABLE) ×8
KIT PROCEDURE FLUENT (KITS) ×4 IMPLANT
KIT TURNOVER KIT B (KITS) ×4 IMPLANT
NS IRRIG 1000ML POUR BTL (IV SOLUTION) ×4 IMPLANT
PACK VAGINAL MINOR WOMEN LF (CUSTOM PROCEDURE TRAY) ×4 IMPLANT
PAD OB MATERNITY 4.3X12.25 (PERSONAL CARE ITEMS) ×4 IMPLANT
POUCH LAPAROSCOPIC INSTRUMENT (MISCELLANEOUS) ×4 IMPLANT
PROTECTOR NERVE ULNAR (MISCELLANEOUS) ×8 IMPLANT
SYR 5ML LL (SYRINGE) IMPLANT
TOWEL GREEN STERILE FF (TOWEL DISPOSABLE) ×8 IMPLANT
TRAY FOLEY W/BAG SLVR 14FR (SET/KITS/TRAYS/PACK) ×4 IMPLANT
TROCAR XCEL NON-BLD 5MMX100MML (ENDOMECHANICALS) ×4 IMPLANT

## 2021-07-10 NOTE — Discharge Instructions (Addendum)
Call [facility] @ [phone] if:  You have a temperature greater than or equal to 100.4 degrees Farenheit orally You have pain that is not made better by the pain medication given and taken as directed You have excessive bleeding or problems urinating  Take Colace (Docusate Sodium/Stool Softener) 100 mg 2-3 times daily while taking narcotic pain medicine to avoid constipation or until bowel movements are regular. Take, with food, Ibuprofen 600 mg every 6 hours for 5 days then as needed for pain  You may drive after 24 hours You may walk up steps  You may shower tomorrow You may resume a regular diet  Do not lift over 15 pounds for 2 weeks Avoid anything in vagina until after your post-operative visit

## 2021-07-10 NOTE — Op Note (Signed)
OPERATIVE NOTE  SHARLEEN LULE  DOB:    1985-12-28  MRN:    [id number]  CSN:    [id number]  Date of Surgery:  07/10/2021  Preoperative Diagnosis: Misplaced IUD  Postoperative Diagnosis: same  Procedure: 1. Dilatation of cervix 2. Diagnostic Hysteroscopy 3. Removal of IUD   Surgeon: Mady Haagensen. Alwyn Pea, M.D.  Assistant: Earnstine Regal, PAC (on standby if laparoscopy was needed)  Anesthetic: General ETA    Fluids: 834m LR  UOP: 50 mL  Catheter: Foley during case  Deficit: 320 Fluid used: 579  EBL: 5 mL  Indication:  Patient is a 35y.o. year old female with misplaced Mirena IUD as seen on office ultrasound. Patient with a clinical history of pelvic pain. Risks, benefits, alternatives and indication was explained to patient prior to the procedure.   Findings:  Exam under anesthesia: Normal uterus, mobile anteverted, no palpable masses, Fullness palpable left adnexa  Vagina: normal ruggae, cervix grossly normal; IUD strings seen at external os Hysteroscopy: polypoid tissue in endometrium, both ostia visualized; The T portion of the Mirena IUD appears embedded in anterior myometrium, the bottom is in endometrium    Procedure:  The patient was taken to the operating room where general anesthesia was found to be adequate. She was placed in dorsal lithotomy position and examined under anesthesia was performed with findings noted above. The patient's abdomen, perineum, and vagina were prepped with sterile solution. The bladder was drained of urine with a foley catheter. A sterile operative graves speculum was placed and the anterior lip of the cervix held with a single-toothed tenaculum. The uterus was sounded to 7cm. The cervix was dilated to 15 Fr with Hanks dilators. A 11 mm hysteroscope was introduced into the uterine cavity under direct video visualization and the cavity distended with sterile normal saline. A complete survey was performed with findings noted above. The hysteroscope  was removed and using a ring forceps the IUD strings were pulled and the IUD was removed in its entirety.   A repeat diagnostic hysteroscopy was performed to ensure there was no active bleeding from the myometrium and it was noted to be hemostatic.   All instrumentation was removed from the vagina and the cervix had small amount of oozing where the single toothed tenaculum was located. A silver nitrate stick was used to aid in hemostasis. A paracervical block was done using 1% plain lidocaine for patient comfort. Sponge, needle, instrument counts were correct. The estimated blood loss was less than 10 cc. The patient tolerated her procedure well. She was awakened from her anesthetic without difficulty. She was transported to the recovery room in stable condition.   WRogelio SeenS. PAlwyn Pea M.D.

## 2021-07-10 NOTE — Transfer of Care (Signed)
Immediate Anesthesia Transfer of Care Note  Patient: Sharon Thornton  Procedure(s) Performed: HYSTEROSCOPY INTRAUTERINE DEVICE (IUD) REMOVAL  Patient Location: PACU  Anesthesia Type:General  Level of Consciousness: awake  Airway & Oxygen Therapy: Patient Spontanous Breathing  Post-op Assessment: Report given to RN and Post -op Vital signs reviewed and stable  Post vital signs: Reviewed and stable  Last Vitals:  Vitals Value Taken Time  BP    Temp    Pulse 92 07/10/21 1424  Resp 14 07/10/21 1424  SpO2 96 % 07/10/21 1424  Vitals shown include unvalidated device data.  Last Pain:  Vitals:   07/10/21 1120  TempSrc:   PainSc: 4       Patients Stated Pain Goal: 0 ([id number])  Complications: No notable events documented.

## 2021-07-10 NOTE — Anesthesia Preprocedure Evaluation (Signed)
Anesthesia Evaluation  Patient identified by MRN, date of birth, ID band Patient awake    Reviewed: Allergy & Precautions, NPO status , Patient's Chart, lab work & pertinent test results  Airway Mallampati: I  TM Distance: >3 FB Neck ROM: Full    Dental  (+) Teeth Intact, Dental Advisory Given   Pulmonary neg pulmonary ROS,    Pulmonary exam normal breath sounds clear to auscultation       Cardiovascular negative cardio ROS Normal cardiovascular exam Rhythm:Regular Rate:Normal     Neuro/Psych negative neurological ROS  negative psych ROS   GI/Hepatic negative GI ROS, Neg liver ROS,   Endo/Other  Obesity   Renal/GU negative Renal ROS     Musculoskeletal negative musculoskeletal ROS (+)   Abdominal   Peds  Hematology negative hematology ROS (+)   Anesthesia Other Findings Day of surgery medications reviewed with the patient.  Reproductive/Obstetrics DISPLACED INTRAUTERINE DEVICE                             Anesthesia Physical Anesthesia Plan  ASA: 2  Anesthesia Plan: General   Post-op Pain Management:    Induction: Intravenous  PONV Risk Score and Plan: 4 or greater and Midazolam, Dexamethasone and Ondansetron  Airway Management Planned: Oral ETT  Additional Equipment:   Intra-op Plan:   Post-operative Plan: Extubation in OR  Informed Consent: I have reviewed the patients History and Physical, chart, labs and discussed the procedure including the risks, benefits and alternatives for the proposed anesthesia with the patient or authorized representative who has indicated his/her understanding and acceptance.     Dental advisory given  Plan Discussed with: CRNA  Anesthesia Plan Comments:         Anesthesia Quick Evaluation

## 2021-07-10 NOTE — H&P (Signed)
Sharon Thornton is an 35 y.o. female with pelvic pain. Patient notes that the pain started in June, and was significantly worse during intercourse with her spouse. Patient denies severe, debilitating pain, characterizes it as cramping.  Non-radiating. No associated symptoms ie nausea or vomiting. She has minimal spotting due to the Mirena IUD that was placed 03/28/2018.  Patient notes normal urinary and defecatory function.   Pertinent Gynecological History: Menses: flow is light Bleeding: normal Contraception: IUD DES exposure: denies Blood transfusions: none Sexually transmitted diseases: no past history Previous GYN Procedures:  none   Last mammogram: n/a Last pap: normal Date: 06/29/2021 OB History: G3, P3   Menstrual History: Menarche age: 36 Patient's last menstrual period was 06/15/2021.    Past Medical History:  Diagnosis Date   Anemia    Family history of breast cancer    Family history of colon cancer    Family history of prostate cancer    No pertinent past medical history     Past Surgical History:  Procedure Laterality Date   WISDOM TOOTH EXTRACTION     WRIST SURGERY Right    x2    Family History  Problem Relation Age of Onset   Heart disease Paternal Uncle    Diabetes Maternal Grandmother    Hypertension Maternal Grandmother    Diabetes Maternal Grandfather    Heart disease Maternal Grandfather    Hypertension Maternal Grandfather    Diabetes Paternal Grandmother    Colon cancer Paternal Grandmother 38   Breast cancer Paternal Grandmother        diagnosed in her 30s or 85s   Colon cancer Father 78       stage 3 at dignosis    Social History:  reports that she has never smoked. She has never used smokeless tobacco. She reports current alcohol use. She reports that she does not use drugs.  Allergies: No Known Allergies  Medications Prior to Admission  Medication Sig Dispense Refill Last Dose   acetaminophen (TYLENOL) 500 MG tablet Take 1,000 mg by  mouth every 8 (eight) hours as needed for moderate pain.   Past Week   ibuprofen (ADVIL) 200 MG tablet Take 600 mg by mouth every 8 (eight) hours as needed for moderate pain.   Past Week   levonorgestrel (MIRENA, 52 MG,) 20 MCG/24HR IUD Mirena 20 mcg/24 hours (5 yrs) 52 mg intrauterine device  Take 1 device by intrauterine route.      traMADol (ULTRAM) 50 MG tablet Take 50 mg by mouth every 4 (four) hours as needed for severe pain.   07/09/2021    Review of Systems  as per HPI  Blood pressure 125/62, pulse 60, temperature 97.6 F (36.4 C), temperature source Oral, resp. rate 17, height '5\' 7"'$  (1.702 m), weight 108.4 kg, last menstrual period 06/15/2021, SpO2 100 %, not currently breastfeeding. Physical Exam Vulva: no masses, no atrophy, no lesions Bladder/Urethra: normal meatus, no urethral discharge, no urethral mass, bladder non distended Vagina no tenderness, no erythema, no abnormal vaginal discharge, no vesicle(s) or ulcers (no pelvic relaxation appreciated with val salva maneuvers) Cervix: grossly normal, no discharge, no cervical motion tenderness (strings visible) Uterus: normal size, normal shape, midline, mobile, tender (mildly tender) Adnexa/Parametria: no parametrial tenderness, no parametrial mass, no adnexal tenderness, no ovarian mass Results for orders placed or performed during the hospital encounter of 07/10/21 (from the past 24 hour(s))  Pregnancy, urine POC     Status: None   Collection Time: 07/10/21 11:14 AM  Result Value Ref Range   Preg Test, Ur NEGATIVE NEGATIVE    Pelvic US (done in office) Transabdominal / transvaginal US performed Anteverted uterus, measures 7.1cm fundus to external os Endometrium appears within normal limits - normal morphology on 3D rendering IUD appears displaced on 3D rendering. On 2D image body of IUD can be seen within anterior myometrium on 3D rendering, extended Tbars are seen in fundal portion of anterior myometrium Right ovary appears  within normal limits Left ovary appears within normal limits Small amount of free fluid noted in culdesac Bilateral adnexas appear unremarkable Bladder appears unremarkable  Assessment/Plan: 35 year old with misplaced IUD Will attempt to remove it hysteroscopically, however patient counseled that we may need to perform a laparoscopic procedure to remove.  She was informed of the potential risks to include the necessity to involve General Surgery for a possible bowel resection and diverting colostomy; the possibility of having to complete the procedure via an exploratory laparotomy incision; bleeding, infection, injury to adjacent organs etc.   Patient voiced understanding and agrees to proceed.  Consent signed, witnessed and placed into chart.   Sharon Thornton 07/10/2021, 12:04 PM

## 2021-07-10 NOTE — Anesthesia Procedure Notes (Signed)
Procedure Name: Intubation Date/Time: 07/10/2021 1:27 PM Performed by: Terrence Dupont, CRNA Pre-anesthesia Checklist: Patient identified, Emergency Drugs available, Suction available and Patient being monitored Patient Re-evaluated:Patient Re-evaluated prior to induction Oxygen Delivery Method: Circle system utilized Preoxygenation: Pre-oxygenation with 100% oxygen Induction Type: IV induction Ventilation: Mask ventilation without difficulty Laryngoscope Size: Mac and 3 Grade View: Grade I Tube type: Oral Tube size: 7.0 mm Number of attempts: 1 Airway Equipment and Method: Stylet and Oral airway Placement Confirmation: ETT inserted through vocal cords under direct vision, positive ETCO2 and breath sounds checked- equal and bilateral Tube secured with: Tape Dental Injury: Teeth and Oropharynx as per pre-operative assessment

## 2021-07-11 ENCOUNTER — Encounter (HOSPITAL_COMMUNITY): Payer: Self-pay | Admitting: Obstetrics & Gynecology

## 2021-07-11 NOTE — Anesthesia Postprocedure Evaluation (Signed)
Anesthesia Post Note  Patient: Sharon Thornton  Procedure(s) Performed: HYSTEROSCOPY INTRAUTERINE DEVICE (IUD) REMOVAL     Patient location during evaluation: PACU Anesthesia Type: General Level of consciousness: awake and alert Pain management: pain level controlled Vital Signs Assessment: post-procedure vital signs reviewed and stable Respiratory status: spontaneous breathing, nonlabored ventilation, respiratory function stable and patient connected to nasal cannula oxygen Cardiovascular status: blood pressure returned to baseline and stable Postop Assessment: no apparent nausea or vomiting Anesthetic complications: no   No notable events documented.  Last Vitals:  Vitals:   07/10/21 1445 07/10/21 1500  BP: 105/69 113/64  Pulse: 62 (!) 56  Resp: 14 12  Temp:  (!) 36.4 C  SpO2: 100% 98%    Last Pain:  Vitals:   07/10/21 1500  TempSrc:   PainSc: [place]

## 2021-08-23 NOTE — H&P (Signed)
Sharon Thornton is a 35 y.o.  female, P: 3-0-0-3 presents for tubal sterilization and endometrial ablation because of her desire to cease childbearing and menorrhagia.  Since menarche the patient reports having had heavy menstrual bleeding. In the past she used oral contraceptives but became pregnant while taking them.  After her deliveries she received a Mirena IUD which helped her flow tremendously but became perforated and had to be surgically removed.  Her menstrual flow has lasted 1.5 weeks with the need to change a pad with a tampon every 1.5 hour. Fortunately her cramping is only rated a 5/10 and is relieved with Ibuprofen 600 mg.  She denies any dyspareunia, vaginitis symptoms, non-menstrual pelvic pain, changes in bowel or bladder function. A pelvic ultrasound in August 2022 (prior to removal of IUD) revealed an anteverted uterus: (7.1 cm from fundus to external os): 4.27 x 3.80 x 4.75 cm, endometrium: 5.24 mm with IUD displaced anteriorly-fundally within myometrium;  right ovary-2.68 cm and left ovary-2.45 cm.   Given the chronicity of her menstrual flow and her desire to cease childbearing,  the patient has decided to proceed with endometrial ablation and tubal sterilization.     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   ROS: Admits to occasional stress incontinence symptoms but denies corrective lenses, headache, vision changes, nasal congestion, dysphagia, tinnitus, dizziness, hoarseness, cough,  chest pain, shortness of breath, nausea, vomiting, diarrhea,constipation,  urinary frequency, urgency  dysuria, hematuria, vaginitis symptoms, pelvic pain, swelling of joints,easy bruising,  myalgias, arthralgias, skin rashes, unexplained weight loss and except as is mentioned in the history of present illness, patient's review of systems is otherwise negative.    Physical Exam  Bp: 122/70; P: 79 bpm; Weight: 238 lbs.; Height: 5'7";  BMI: 37.3;  O2Sat.: 99 % (room air); Temperature: 98.7 degrees F orally  Neck: supple without masses or thyromegaly Lungs: clear to auscultation Heart: regular rate and rhythm Abdomen: soft, non-tender and no organomegaly Pelvic:EGBUS- wnl; vagina-normal rugae; uterus-normal size, cervix without lesions or motion tenderness; adnexae-no tenderness or masses Extremities:  no clubbing, cyanosis or edema   Assesment: Menorrhagia                      Desire for Sterilzation   Disposition:  A discussion was held with patient regarding the indication for her procedure(s) along with the risks, which include but are not limited to: reaction to anesthesia, damage to adjacent organs, infection and excessive bleeding. The patient verbalized understanding of these risks and has consented to proceed with a Laparoscopic Bilateral Salpingectomy, Hysteroscopy, Dilatation, Curettage and Novasure Endometrial Ablation at [facility] on  September 01, 2021.   CSN# [id number]   Shaneese Tait J. Florene Glen, PA-C  for Dr. Mady Haagensen. [place]

## 2021-08-28 ENCOUNTER — Encounter (HOSPITAL_BASED_OUTPATIENT_CLINIC_OR_DEPARTMENT_OTHER): Payer: Self-pay | Admitting: Obstetrics & Gynecology

## 2021-08-28 ENCOUNTER — Other Ambulatory Visit: Payer: Self-pay

## 2021-08-28 NOTE — Progress Notes (Signed)
Spoke w/ via phone for pre-op interview--- [place]----  UPT             Lab results------ COVID test -----patient states asymptomatic no test needed Arrive at ------- 1115 NPO after MN NO Solid Food.  Clear liquids from MN until--- 1015 Med rec completed Medications to take morning of surgery ----- NONE Diabetic medication ----- Patient instructed no nail polish to be worn day of surgery Patient instructed to bring photo id and insurance card day of surgery Patient aware to have Driver (ride ) / caregiver    for 24 hours after surgery  Patient Special Instructions ----- Pre-Op special Istructions ----- Patient verbalized understanding of instructions that were given at this phone interview. Patient denies shortness of breath, chest pain, fever, cough at this phone interview.

## 2021-08-31 ENCOUNTER — Other Ambulatory Visit: Payer: Self-pay | Admitting: Obstetrics & Gynecology

## 2021-09-01 ENCOUNTER — Ambulatory Visit (HOSPITAL_BASED_OUTPATIENT_CLINIC_OR_DEPARTMENT_OTHER)
Admission: RE | Admit: 2021-09-01 | Discharge: 2021-09-01 | Disposition: A | Discharge status: Home / Self Care | Payer: Medicaid Other | Attending: Obstetrics & Gynecology | Admitting: Obstetrics & Gynecology

## 2021-09-01 ENCOUNTER — Ambulatory Visit (HOSPITAL_BASED_OUTPATIENT_CLINIC_OR_DEPARTMENT_OTHER): Payer: Medicaid Other | Admitting: Anesthesiology

## 2021-09-01 ENCOUNTER — Encounter (HOSPITAL_BASED_OUTPATIENT_CLINIC_OR_DEPARTMENT_OTHER)
Admission: RE | Disposition: A | Discharge status: Home / Self Care | Payer: Self-pay | Source: Home / Self Care | Attending: Obstetrics & Gynecology

## 2021-09-01 ENCOUNTER — Other Ambulatory Visit: Payer: Self-pay

## 2021-09-01 ENCOUNTER — Encounter (HOSPITAL_BASED_OUTPATIENT_CLINIC_OR_DEPARTMENT_OTHER): Payer: Self-pay | Admitting: Obstetrics & Gynecology

## 2021-09-01 DIAGNOSIS — Z302 Encounter for sterilization: Secondary | ICD-10-CM | POA: Insufficient documentation

## 2021-09-01 DIAGNOSIS — N920 Excessive and frequent menstruation with regular cycle: Secondary | ICD-10-CM | POA: Insufficient documentation

## 2021-09-01 HISTORY — PX: LAPAROSCOPIC BILATERAL SALPINGECTOMY: SHX5889

## 2021-09-01 HISTORY — PX: DILITATION & CURRETTAGE/HYSTROSCOPY WITH NOVASURE ABLATION: SHX5568

## 2021-09-01 LAB — CBC WITH DIFFERENTIAL/PLATELET
Abs Immature Granulocytes: 0.01 10*3/uL (ref 0.00–0.07)
Basophils Absolute: 0.1 10*3/uL (ref 0.0–0.1)
Basophils Relative: 1 %
Eosinophils Absolute: 0.2 10*3/uL (ref 0.0–0.5)
Eosinophils Relative: 4 %
HCT: 41.5 % (ref 36.0–46.0)
Hemoglobin: 13.8 g/dL (ref 12.0–15.0)
Immature Granulocytes: 0 %
Lymphocytes Relative: 24 %
Lymphs Abs: 1.3 10*3/uL (ref 0.7–4.0)
MCH: 27.7 pg (ref 26.0–34.0)
MCHC: 33.3 g/dL (ref 30.0–36.0)
MCV: 83.3 fL (ref 80.0–100.0)
Monocytes Absolute: 0.3 10*3/uL (ref 0.1–1.0)
Monocytes Relative: 5 %
Neutro Abs: 3.5 10*3/uL (ref 1.7–7.7)
Neutrophils Relative %: 66 %
Platelets: 261 10*3/uL (ref 150–400)
RBC: 4.98 MIL/uL (ref 3.87–5.11)
RDW: 13.4 % (ref 11.5–15.5)
WBC: 5.3 10*3/uL (ref 4.0–10.5)
nRBC: 0 % (ref 0.0–0.2)

## 2021-09-01 LAB — POCT PREGNANCY, URINE: Preg Test, Ur: NEGATIVE

## 2021-09-01 SURGERY — SALPINGECTOMY, BILATERAL, LAPAROSCOPIC
Anesthesia: General | Site: Vagina

## 2021-09-01 MED ORDER — ONDANSETRON HCL 4 MG/2ML IJ SOLN
INTRAMUSCULAR | Status: AC
  Filled 2021-09-01: qty 2

## 2021-09-01 MED ORDER — HEMOSTATIC AGENTS (NO CHARGE) OPTIME
TOPICAL | Status: DC | PRN
  Administered 2021-09-01: 1 via TOPICAL

## 2021-09-01 MED ORDER — MIDAZOLAM HCL 2 MG/2ML IJ SOLN
INTRAMUSCULAR | Status: AC
  Filled 2021-09-01: qty 2

## 2021-09-01 MED ORDER — PROMETHAZINE HCL 25 MG/ML IJ SOLN: 6.2500 mg | INTRAMUSCULAR | Status: DC | PRN

## 2021-09-01 MED ORDER — OXYCODONE HCL 5 MG PO TABS
ORAL_TABLET | ORAL | Status: AC
  Filled 2021-09-01: qty 1

## 2021-09-01 MED ORDER — SODIUM CHLORIDE 0.9 % IR SOLN
Status: DC | PRN
  Administered 2021-09-01: 3000 mL
  Administered 2021-09-01: 1000 mL

## 2021-09-01 MED ORDER — SUGAMMADEX SODIUM 200 MG/2ML IV SOLN
INTRAVENOUS | Status: DC | PRN
  Administered 2021-09-01: 220 mg via INTRAVENOUS

## 2021-09-01 MED ORDER — FENTANYL CITRATE (PF) 100 MCG/2ML IJ SOLN
INTRAMUSCULAR | Status: AC
  Filled 2021-09-01: qty 2

## 2021-09-01 MED ORDER — ONDANSETRON HCL 4 MG/2ML IJ SOLN
INTRAMUSCULAR | Status: DC | PRN
  Administered 2021-09-01: 4 mg via INTRAVENOUS

## 2021-09-01 MED ORDER — ROCURONIUM BROMIDE 10 MG/ML (PF) SYRINGE
PREFILLED_SYRINGE | INTRAVENOUS | Status: DC | PRN
  Administered 2021-09-01: 50 mg via INTRAVENOUS

## 2021-09-01 MED ORDER — AMISULPRIDE (ANTIEMETIC) 5 MG/2ML IV SOLN: 10.0000 mg | Freq: Once | INTRAVENOUS | Status: DC | PRN

## 2021-09-01 MED ORDER — CEFAZOLIN SODIUM-DEXTROSE 2-4 GM/100ML-% IV SOLN
INTRAVENOUS | Status: AC
  Filled 2021-09-01: qty 100

## 2021-09-01 MED ORDER — LACTATED RINGERS IV SOLN: INTRAVENOUS | Status: DC

## 2021-09-01 MED ORDER — MIDAZOLAM HCL 5 MG/5ML IJ SOLN
INTRAMUSCULAR | Status: DC | PRN
  Administered 2021-09-01: 2 mg via INTRAVENOUS

## 2021-09-01 MED ORDER — SILVER NITRATE-POT NITRATE 75-25 % EX MISC
CUTANEOUS | Status: DC | PRN
  Administered 2021-09-01: 2

## 2021-09-01 MED ORDER — SCOPOLAMINE 1 MG/3DAYS TD PT72
1.0000 | MEDICATED_PATCH | TRANSDERMAL | Status: DC
  Administered 2021-09-01: 1.5 mg via TRANSDERMAL

## 2021-09-01 MED ORDER — LIDOCAINE 2% (20 MG/ML) 5 ML SYRINGE
INTRAMUSCULAR | Status: DC | PRN
  Administered 2021-09-01: 50 mg via INTRAVENOUS

## 2021-09-01 MED ORDER — OXYCODONE-ACETAMINOPHEN 5-325 MG PO TABS: ORAL_TABLET | ORAL | 0 refills | Status: DC

## 2021-09-01 MED ORDER — ACETAMINOPHEN 10 MG/ML IV SOLN: 1000.0000 mg | Freq: Once | INTRAVENOUS | Status: DC | PRN

## 2021-09-01 MED ORDER — LIDOCAINE HCL 1 % IJ SOLN
INTRAMUSCULAR | Status: DC | PRN
  Administered 2021-09-01: 10 mL

## 2021-09-01 MED ORDER — OXYCODONE HCL 5 MG/5ML PO SOLN: 5.0000 mg | Freq: Once | ORAL | Status: AC | PRN

## 2021-09-01 MED ORDER — CEFAZOLIN SODIUM-DEXTROSE 2-4 GM/100ML-% IV SOLN
2.0000 g | INTRAVENOUS | Status: AC
  Administered 2021-09-01: 2 g via INTRAVENOUS

## 2021-09-01 MED ORDER — FENTANYL CITRATE (PF) 100 MCG/2ML IJ SOLN: 25.0000 ug | INTRAMUSCULAR | Status: DC | PRN

## 2021-09-01 MED ORDER — KETOROLAC TROMETHAMINE 30 MG/ML IJ SOLN
INTRAMUSCULAR | Status: AC
  Filled 2021-09-01: qty 1

## 2021-09-01 MED ORDER — PROPOFOL 10 MG/ML IV BOLUS
INTRAVENOUS | Status: DC | PRN
  Administered 2021-09-01: 140 mg via INTRAVENOUS

## 2021-09-01 MED ORDER — BUPIVACAINE HCL 0.25 % IJ SOLN
INTRAMUSCULAR | Status: DC | PRN
  Administered 2021-09-01: 30 mL

## 2021-09-01 MED ORDER — FENTANYL CITRATE (PF) 100 MCG/2ML IJ SOLN
INTRAMUSCULAR | Status: DC | PRN
  Administered 2021-09-01: 50 ug via INTRAVENOUS
  Administered 2021-09-01: 25 ug via INTRAVENOUS
  Administered 2021-09-01: 100 ug via INTRAVENOUS
  Administered 2021-09-01: 25 ug via INTRAVENOUS

## 2021-09-01 MED ORDER — ACETAMINOPHEN 160 MG/5ML PO SOLN: 325.0000 mg | ORAL | Status: DC | PRN

## 2021-09-01 MED ORDER — DEXAMETHASONE SODIUM PHOSPHATE 10 MG/ML IJ SOLN
INTRAMUSCULAR | Status: DC | PRN
  Administered 2021-09-01: 10 mg via INTRAVENOUS

## 2021-09-01 MED ORDER — DEXAMETHASONE SODIUM PHOSPHATE 10 MG/ML IJ SOLN
INTRAMUSCULAR | Status: AC
  Filled 2021-09-01: qty 1

## 2021-09-01 MED ORDER — OXYCODONE HCL 5 MG PO TABS
5.0000 mg | ORAL_TABLET | Freq: Once | ORAL | Status: AC | PRN
  Administered 2021-09-01: 5 mg via ORAL

## 2021-09-01 MED ORDER — IBUPROFEN 600 MG PO TABS: ORAL_TABLET | ORAL | 0 refills | Status: DC

## 2021-09-01 MED ORDER — SCOPOLAMINE 1 MG/3DAYS TD PT72
MEDICATED_PATCH | TRANSDERMAL | Status: AC
  Filled 2021-09-01: qty 1

## 2021-09-01 MED ORDER — ACETAMINOPHEN 325 MG PO TABS: 325.0000 mg | ORAL_TABLET | ORAL | Status: DC | PRN

## 2021-09-01 MED ORDER — KETOROLAC TROMETHAMINE 30 MG/ML IJ SOLN
INTRAMUSCULAR | Status: DC | PRN
  Administered 2021-09-01: 30 mg via INTRAVENOUS

## 2021-09-01 MED ORDER — ROCURONIUM BROMIDE 10 MG/ML (PF) SYRINGE
PREFILLED_SYRINGE | INTRAVENOUS | Status: AC
  Filled 2021-09-01: qty 10

## 2021-09-01 MED ORDER — LIDOCAINE 2% (20 MG/ML) 5 ML SYRINGE
INTRAMUSCULAR | Status: AC
  Filled 2021-09-01: qty 5

## 2021-09-01 SURGICAL SUPPLY — 59 items
ABLATOR SURESOUND NOVASURE (ABLATOR) ×3 IMPLANT
ADH SKN CLS APL DERMABOND .7 (GAUZE/BANDAGES/DRESSINGS) ×2
APL SKNCLS STERI-STRIP NONHPOA (GAUZE/BANDAGES/DRESSINGS)
APL SRG 38 LTWT LNG FL B (MISCELLANEOUS) ×2
APPLICATOR ARISTA FLEXITIP XL (MISCELLANEOUS) ×3 IMPLANT
BAG RETRIEVAL 10 (BASKET)
BENZOIN TINCTURE PRP APPL 2/3 (GAUZE/BANDAGES/DRESSINGS) IMPLANT
BIPOLAR CUTTING LOOP 21FR (ELECTRODE)
CABLE HIGH FREQUENCY MONO STRZ (ELECTRODE) IMPLANT
CATH ROBINSON RED A/P 16FR (CATHETERS) IMPLANT
COVER MAYO STAND STRL (DRAPES) ×3 IMPLANT
DERMABOND ADVANCED (GAUZE/BANDAGES/DRESSINGS) ×1
DERMABOND ADVANCED .7 DNX12 (GAUZE/BANDAGES/DRESSINGS) ×2 IMPLANT
DILATOR CANAL MILEX (MISCELLANEOUS) IMPLANT
DRSG COVADERM PLUS 2X2 (GAUZE/BANDAGES/DRESSINGS) IMPLANT
DRSG OPSITE POSTOP 3X4 (GAUZE/BANDAGES/DRESSINGS) IMPLANT
DRSG TELFA 3X8 NADH (GAUZE/BANDAGES/DRESSINGS) ×3 IMPLANT
DURAPREP 26ML APPLICATOR (WOUND CARE) ×3 IMPLANT
ELECT REM PT RETURN 9FT ADLT (ELECTROSURGICAL) ×3
ELECTRODE REM PT RTRN 9FT ADLT (ELECTROSURGICAL) ×2 IMPLANT
GAUZE 4X4 16PLY ~~LOC~~+RFID DBL (SPONGE) ×6 IMPLANT
GAUZE PETROLATUM 1 X8 (GAUZE/BANDAGES/DRESSINGS) IMPLANT
GLOVE SURG ENC MOIS LTX SZ6.5 (GLOVE) ×3 IMPLANT
GLOVE SURG UNDER POLY LF SZ6.5 (GLOVE) ×6 IMPLANT
GLOVE SURG UNDER POLY LF SZ7 (GLOVE) ×12 IMPLANT
GLOVE SURG UNDER POLY LF SZ7.5 (GLOVE) ×6 IMPLANT
GOWN STRL REUS W/TWL LRG LVL3 (GOWN DISPOSABLE) ×15 IMPLANT
HEMOSTAT ARISTA ABSORB 3G PWDR (HEMOSTASIS) ×3 IMPLANT
HOLDER FOLEY CATH W/STRAP (MISCELLANEOUS) IMPLANT
KIT PROCEDURE FLUENT (KITS) ×3 IMPLANT
KIT TURNOVER CYSTO (KITS) ×3 IMPLANT
LIGASURE VESSEL 5MM BLUNT TIP (ELECTROSURGICAL) ×3 IMPLANT
LOOP CUTTING BIPOLAR 21FR (ELECTRODE) IMPLANT
MANIPULATOR VCARE LG CRV RETR (MISCELLANEOUS) IMPLANT
MANIPULATOR VCARE SML CRV RETR (MISCELLANEOUS) IMPLANT
NS IRRIG 500ML POUR BTL (IV SOLUTION) ×3 IMPLANT
PACK LAPAROSCOPY BASIN (CUSTOM PROCEDURE TRAY) ×3 IMPLANT
PACK VAGINAL MINOR WOMEN LF (CUSTOM PROCEDURE TRAY) ×3 IMPLANT
PAD OB MATERNITY 4.3X12.25 (PERSONAL CARE ITEMS) ×3 IMPLANT
PAD PREP 24X48 CUFFED NSTRL (MISCELLANEOUS) ×3 IMPLANT
POUCH LAPAROSCOPIC INSTRUMENT (MISCELLANEOUS) ×3 IMPLANT
SCISSORS LAP 5X35 DISP (ENDOMECHANICALS) ×3 IMPLANT
SEAL ROD LENS SCOPE MYOSURE (ABLATOR) ×3 IMPLANT
SET SUCTION IRRIG HYDROSURG (IRRIGATION / IRRIGATOR) ×6 IMPLANT
SET TRI-LUMEN FLTR TB AIRSEAL (TUBING) ×3 IMPLANT
SOLUTION ELECTROLUBE (MISCELLANEOUS) IMPLANT
STRIP CLOSURE SKIN 1/4X4 (GAUZE/BANDAGES/DRESSINGS) IMPLANT
SUT MNCRL AB 4-0 PS2 18 (SUTURE) ×3 IMPLANT
SUT VICRYL 0 UR6 27IN ABS (SUTURE) ×3 IMPLANT
SYS BAG RETRIEVAL 10MM (BASKET)
SYSTEM BAG RETRIEVAL 10MM (BASKET) IMPLANT
TOWEL OR 17X26 10 PK STRL BLUE (TOWEL DISPOSABLE) ×6 IMPLANT
TRAY FOLEY W/BAG SLVR 14FR LF (SET/KITS/TRAYS/PACK) ×3 IMPLANT
TROCAR BLADELESS OPT 5 100 (ENDOMECHANICALS) ×3 IMPLANT
TROCAR PORT AIRSEAL 8X120 (TROCAR) ×3 IMPLANT
TROCAR XCEL NON-BLD 11X100MML (ENDOMECHANICALS) ×3 IMPLANT
TUBE CONNECTING 12X1/4 (SUCTIONS) IMPLANT
WARMER LAPAROSCOPE (MISCELLANEOUS) ×3 IMPLANT
WATER STERILE IRR 500ML POUR (IV SOLUTION) ×3 IMPLANT

## 2021-09-01 NOTE — Anesthesia Postprocedure Evaluation (Signed)
Anesthesia Post Note  Patient: Sharon Thornton  Procedure(s) Performed: LAPAROSCOPIC BILATERAL SALPINGECTOMY (Bilateral: Abdomen) DILATATION & CURETTAGE/HYSTEROSCOPY WITH NOVASURE ABLATION (Vagina )     Patient location during evaluation: PACU Anesthesia Type: General Level of consciousness: awake and alert Pain management: pain level controlled Vital Signs Assessment: post-procedure vital signs reviewed and stable Respiratory status: spontaneous breathing, nonlabored ventilation and respiratory function stable Cardiovascular status: blood pressure returned to baseline and stable Postop Assessment: no apparent nausea or vomiting Anesthetic complications: no   No notable events documented.  Last Vitals:  Vitals:   09/01/21 1515 09/01/21 1614  BP: 123/64 113/72  Pulse:  63  Resp:  16  Temp: 36.6 C 36.6 C  SpO2: 100% 100%    Last Pain:  Vitals:   09/01/21 1614  TempSrc:   PainSc: 5                  Catalina Gravel

## 2021-09-01 NOTE — Anesthesia Preprocedure Evaluation (Addendum)
Anesthesia Evaluation  Patient identified by MRN, date of birth, ID band Patient awake    Reviewed: Allergy & Precautions, NPO status , Patient's Chart, lab work & pertinent test results  Airway Mallampati: I  TM Distance: >3 FB Neck ROM: Full    Dental  (+) Teeth Intact, Dental Advisory Given   Pulmonary neg pulmonary ROS,    breath sounds clear to auscultation       Cardiovascular negative cardio ROS   Rhythm:Regular Rate:Normal     Neuro/Psych negative neurological ROS  negative psych ROS   GI/Hepatic negative GI ROS, Neg liver ROS,   Endo/Other  negative endocrine ROS  Renal/GU negative Renal ROS     Musculoskeletal   Abdominal Normal abdominal exam  (+)   Peds  Hematology   Anesthesia Other Findings   Reproductive/Obstetrics                            Anesthesia Physical Anesthesia Plan  ASA: 1  Anesthesia Plan: General   Post-op Pain Management:    Induction: Intravenous  PONV Risk Score and Plan: 4 or greater and Ondansetron, Dexamethasone, Midazolam and Scopolamine patch - Pre-op  Airway Management Planned: Oral ETT  Additional Equipment: None  Intra-op Plan:   Post-operative Plan: Extubation in OR  Informed Consent: I have reviewed the patients History and Physical, chart, labs and discussed the procedure including the risks, benefits and alternatives for the proposed anesthesia with the patient or authorized representative who has indicated his/her understanding and acceptance.     Dental advisory given  Plan Discussed with: CRNA  Anesthesia Plan Comments:        Anesthesia Quick Evaluation

## 2021-09-01 NOTE — Anesthesia Procedure Notes (Signed)
Date/Time: 09/01/2021 1:38 PM Performed by: Bonney Aid, CRNA Laryngoscope Size: Mac and 3 Grade View: Grade I Tube size: 7.0 mm Number of attempts: 1 Secured at: 21 cm Tube secured with: Tape

## 2021-09-01 NOTE — Discharge Instructions (Addendum)
Call [facility] @ [phone] if:  You have a temperature greater than or equal to 100.4 degrees Farenheit orally You have pain that is not made better by the pain medication given and taken as directed You have excessive bleeding or problems urinating  Take Colace (Docusate Sodium/Stool Softener) 100 mg 2-3 times daily while taking narcotic pain medicine to avoid constipation or until bowel movements are regular. Take with food Ibuprofen 600 mg every 6 hours for the next 5 days then as needed for pain  You may drive after 36 hours You may walk up steps  You may shower tomorrow You may resume a regular diet  Keep incisions clean and dry Do not lift over 15 pounds for 6 weeks Avoid anything in vagina  until after your post-operative visit  Post Anesthesia Home Care Instructions  Activity: Get plenty of rest for the remainder of the day. A responsible adult should stay with you for 24 hours following the procedure.  For the next 24 hours, DO NOT: -Drive a car -Paediatric nurse -Drink alcoholic beverages -Take any medication unless instructed by your physician -Make any legal decisions or sign important papers.  Meals: Start with liquid foods such as gelatin or soup. Progress to regular foods as tolerated. Avoid greasy, spicy, heavy foods. If nausea and/or vomiting occur, drink only clear liquids until the nausea and/or vomiting subsides. Call your physician if vomiting continues.  Special Instructions/Symptoms: Your throat may feel dry or sore from the anesthesia or the breathing tube placed in your throat during surgery. If this causes discomfort, gargle with warm salt water. The discomfort should disappear within 24 hours.  If you had a scopolamine patch placed behind your ear for the management of post- operative nausea and/or vomiting:  1. The medication in the patch is effective for 72 hours, after which it should be removed.  Wrap patch in a tissue and discard  in the trash. Wash hands thoroughly with soap and water. 2. You may remove the patch earlier than 72 hours if you experience unpleasant side effects which may include dry mouth, dizziness or visual disturbances. 3. Avoid touching the patch. Wash your hands with soap and water after contact with the patch.   DISCHARGE INSTRUCTIONS: Laparoscopy  The following instructions have been prepared to help you care for yourself upon your return home today.  Wound care:  Do not get the incision wet for the first 24 hours. The incision should be kept clean and dry.  The Band-Aids or dressings may be removed the day after surgery.  Should the incision become sore, red, and swollen after the first week, check with your doctor.  Personal hygiene:  Shower the day after your procedure.  Activity and limitations:  Do NOT drive or operate any equipment today.  Do NOT lift anything more than 15 pounds for 2-3 weeks after surgery.  Do NOT rest in bed all day.  Walking is encouraged. Walk each day, starting slowly with 5-minute walks 3 or 4 times a day. Slowly increase the length of your walks.  Walk up and down stairs slowly.  Do NOT do strenuous activities, such as golfing, playing tennis, bowling, running, biking, weight lifting, gardening, mowing, or vacuuming for 2-4 weeks. Ask your doctor when it is okay to start.  Diet: Eat a light meal as desired this evening. You may resume your usual diet tomorrow.  Return to work: This is dependent on the type of work you do. For the most part  you can return to a desk job within a week of surgery. If you are more active at work, please discuss this with your doctor.  What to expect after your surgery: You may have a slight burning sensation when you urinate on the first day. You may have a very small amount of blood in the urine. Expect to have a small amount of vaginal discharge/light bleeding for 1-2 weeks. It is not unusual to have abdominal soreness and bruising  for up to 2 weeks. You may be tired and need more rest for about 1 week. You may experience shoulder pain for 24-72 hours. Lying flat in bed may relieve it.  Call your doctor for any of the following:  Develop a fever of 100.4 or greater  Inability to urinate 6 hours after discharge from hospital  Severe pain not relieved by pain medications  Persistent of heavy bleeding at incision site  Redness or swelling around incision site after a week  Increasing nausea or vomiting

## 2021-09-01 NOTE — Transfer of Care (Signed)
Immediate Anesthesia Transfer of Care Note  Patient: Sharon Thornton  Procedure(s) Performed: Procedure(s) (LRB): LAPAROSCOPIC BILATERAL SALPINGECTOMY (Bilateral) DILATATION & CURETTAGE/HYSTEROSCOPY WITH NOVASURE ABLATION (N/A)  Patient Location: PACU  Anesthesia Type: General  Level of Consciousness: awake, oriented, sedated and patient cooperative  Airway & Oxygen Therapy: Patient Spontanous Breathing and Patient connected to face mask oxygen  Post-op Assessment: Report given to PACU RN and Post -op Vital signs reviewed and stable  Post vital signs: Reviewed and stable  Complications: No apparent anesthesia complications  Last Vitals:  Vitals Value Taken Time  BP 123/64 09/01/21 1516  Temp    Pulse 74 09/01/21 1518  Resp 14 09/01/21 1518  SpO2 100 % 09/01/21 1518  Vitals shown include unvalidated device data.  Last Pain:  Vitals:   09/01/21 1128  TempSrc: Oral         Complications: No notable events documented.

## 2021-09-04 ENCOUNTER — Encounter (HOSPITAL_BASED_OUTPATIENT_CLINIC_OR_DEPARTMENT_OTHER): Payer: Self-pay | Admitting: Obstetrics & Gynecology

## 2021-09-04 LAB — SURGICAL PATHOLOGY

## 2021-09-05 NOTE — Op Note (Signed)
OPERATIVE NOTE  09/01/2021 6:46 PM  PATIENT:  Terressa Koyanagi  35 y.o. female  PRE-OPERATIVE DIAGNOSIS:  DESIRES STERILIZATION MENORRHAGIA  POST-OPERATIVE DIAGNOSIS:  DESIRES STERILIZATION MENORRHAGIA  PROCEDURE:  Procedure(s): LAPAROSCOPIC BILATERAL SALPINGECTOMY (Bilateral) DILATATION & CURETTAGE/HYSTEROSCOPY WITH NOVASURE ABLATION (N/A)  SURGEON:  Surgeon(s) and Role:    * Sanjuana Kava, MD - Primary  PHYSICIAN ASSISTANT: Earnstine Regal  ANESTHESIA:  General ETA, paracervical and local  EBL:  25 mL   DRAINS: Urinary Catheter (Foley) during case  LOCAL MEDICATIONS USED:  LIDOCAINE  and  0.25% Marcaine   SPECIMEN:  1. Bilateral fallopian tubes 2. Endometrial curettings 3. Peritoneal biopsy   DISPOSITION OF SPECIMEN:  PATHOLOGY   COUNTS:  YES   PATIENT DISPOSITION:  PACU - hemodynamically stable.   FINDINGS: Exam under anesthesia: External vulva: Normal vaginal vault: Normal ruggae Cervix: parous, large, grossly normal uterus: 9  weeks size anteverted,  mobile no palpable masses; adnexa: no palpable masses. Hysteroscopy: Hypertrophic endometrium, no suspicious findings, bilateral tubal ostia visualized. Laparoscopic findings: Normal appearing uterus, bilateral fallopian tubes and ovaries, possible endometriotic implant seen along left uterosacral ligament   COMPLICATIONS: None   DESCRIPTION OF OPERATION:    The patient was taken to the Operating Room, identified as Shiquita Collignon and the procedure verified and a Time Out was held and the above information confirmed.  She was placed in the modified lithotomy position using Allen stirrups and then placed under general anesthesia.   An examination under anesthesia was performed.  The abdomen was prepped with ChloraPrep. The perineum and vagina were prepped with multiple layers of Betadine.  The bladder was catheterized with a foley. The abdomen and perineum were draped as a sterile field.  A Hulka tenaculum was placed into the  endometrial canal and fixed to the anterior lip of the cervix.  The surgeon re-  gloved.     0.25% bupivicaine solution was infiltrated in the subumbilical area. A 10 mm subumbilical incision was made through the skin with a #11 blade. Using direct entry 11 mm Excel Optiview port attached to the 0-degree 10 mm operative laparoscope was entered into the tented up abdomen under direct video visualization.  Confirmation of placement was made with the laparoscope and pneumoperitoneum was made with approximately 2L C02 gas.  The patient was placed in steep Trendelenburg. Marcaine injected in the RLQ and LLQ a 5 mm incision was made and 5 mm trocar advanced into the intraabdominal cavity.  There was no noted injury with placement of any trochars.    The abdominal and pelvic cavity was surveyed and examined with findings noted above. The endoscopic graspers held the left fallopian tube. The 5 mm Ligasure was used to cauterize, transected the mesosalpinx of the fallopian tube off the ovary and the isthmic portion from the uterine cornua. The tube was then removed through the ipsilateral port. The above was done for the contralateral (right) fallopian tube. Starting from the fimbriated end, the Ligasure was used to cauterize and transect the mesosalpinx, thus the fallopian tube was freed from the left ovary and finally the uterus.   The possible endometriotic implant along the left uterosacral ligament was incised with endoshears and removed to be sent to Pathology as a peritoneal biopsy.    Each fallopian tube were handed off to be sent to Pathology. There was some oozing from the left mesosalpinx. This was alleviated with cautery via the Ligasure. Irrigation was performed, 0 cc of 0.25% Marcaine was sprayed along the pelvis  and the surgical edges and Doylene Bode was placed over the surgical sites.    After inspecting the entire abdomen one last time, the ports were removed one at a time under direct laparoscopic  visualization. The CO2 gas was then allowed to escape and the subumbilical port was removed with the laparoscope in place allowing visualization of any possible bleeders. All trochars were then removed from the peritoneal cavity under direct visualization as the CO2 was allowed to escape. The supra-umbilical incision was closed with the fascial sutures ()-vicryl on a UR-6 needle) in a figure-of-eight fashion. All skin incisions were closed with subcuticular sutures of 3-0 Monocryl then covered with Dermabond.   Attention was then turned to the vaginal portion of the procedure:  An operative Graves speculum was inserted into the vagina and the Hulka tenaculum removed. The anterior cervical lip was held with a toothed tenaculum. The uterus and the cervix were sounded and measured 9 cm. Hysteroscopy was carried out showing normal endocervical and endometrial cavity. The cervical length was measured hysteroscopically to be 4 cm. Endometrial curettage was carried out yielding profuse curettings which were collected and sent for pathology examination.  A NovaSure endometrial ablation procedure was then performed. The uterine cavity length was registered as on the instrument panel as 4 cm The array was deployed, introduced into the uterus, and the uterine cavity width, measured to be 3.2 cm, was entered on the instrument panel. The cavity assessment was successfully completed followed by initiation of the ablation cycle which was completed  at 30 W power for 108 seconds without difficulty. Repeat hysteroscopy showed adequate ablation of the endometrium.   All the vaginal instruments were removed. Small bleeders where tenaculum was on the anterior cervix were made hemostatic using silver nitrate sticks. The patient was extubated, awakened in operating room and transferred to the PACU in good condition where she will be ultimately sent home. There were no complications.  Rogelio Seen Deryn Massengale

## 2022-01-12 ENCOUNTER — Other Ambulatory Visit: Payer: Self-pay | Admitting: Obstetrics & Gynecology

## 2022-04-05 NOTE — Pre-Procedure Instructions (Signed)
Surgical Instructions    Your procedure is scheduled on Tuesday 04/17/22.   Report to [facility] Main Entrance "A" at 10:15 A.M., then check in with the Admitting office.  Call this number if you have problems the morning of surgery:  [phone]   If you have any questions prior to your surgery date call [phone]: Open Monday-Friday 8am-4pm    Remember:  Do not eat or drink after midnight the night before your surgery     Take these medicines the morning of surgery with A SIP OF WATER:   NONE  As of today, STOP taking any Aspirin (unless otherwise instructed by your surgeon) Aleve, Naproxen, Ibuprofen, Motrin, Advil, Goody's, BC's, all herbal medications, fish oil, and all vitamins.           Do not wear jewelry or makeup Do not wear lotions, powders, perfumes/colognes, or deodorant. Do not shave 48 hours prior to surgery.  Men may shave face and neck. Do not bring valuables to the hospital. Do not wear nail polish, gel polish, artificial nails, or any other type of covering on natural nails (fingers and toes) If you have artificial nails or gel coating that need to be removed by a nail salon, please have this removed prior to surgery. Artificial nails or gel coating may interfere with anesthesia's ability to adequately monitor your vital signs.  [facility] is not responsible for any belongings or valuables. .   Do NOT Smoke (Tobacco/Vaping)  24 hours prior to your procedure  If you use a CPAP at night, you may bring your mask for your overnight stay.   Contacts, glasses, hearing aids, dentures or partials may not be worn into surgery, please bring cases for these belongings   For patients admitted to the hospital, discharge time will be determined by your treatment team.   Patients discharged the day of surgery will not be allowed to drive home, and someone needs to stay with them for 24 hours.   SURGICAL WAITING ROOM VISITATION Patients having surgery or a procedure  in a hospital may have two support people. Children under the age of 75 must have an adult with them who is not the patient. They may stay in the waiting area during the procedure and may switch out with other visitors. If the patient needs to stay at the hospital during part of their recovery, the visitor guidelines for inpatient rooms apply.  Please refer to the [facility] website for the visitor guidelines for Inpatients (after your surgery is over and you are in a regular room).       Special instructions:    Oral Hygiene is also important to reduce your risk of infection.  Remember - BRUSH YOUR TEETH THE MORNING OF SURGERY WITH YOUR REGULAR TOOTHPASTE   - Preparing For Surgery  Before surgery, you can play an important role. Because skin is not sterile, your skin needs to be as free of germs as possible. You can reduce the number of germs on your skin by washing with CHG (chlorahexidine gluconate) Soap before surgery.  CHG is an antiseptic cleaner which kills germs and bonds with the skin to continue killing germs even after washing.     Please do not use if you have an allergy to CHG or antibacterial soaps. If your skin becomes reddened/irritated stop using the CHG.  Do not shave (including legs and underarms) for at least 48 hours prior to first CHG shower. It is OK to shave your face.  Please follow these instructions carefully.     Shower the NIGHT BEFORE SURGERY and the MORNING OF SURGERY with CHG Soap.   If you chose to wash your hair, wash your hair first as usual with your normal shampoo. After you shampoo, rinse your hair and body thoroughly to remove the shampoo.  Then [employer] and genitals (private parts) with your normal soap and rinse thoroughly to remove soap.  After that Use CHG Soap as you would any other liquid soap. You can apply CHG directly to the skin and wash gently with a scrungie or a clean washcloth.   Apply the CHG Soap to your body ONLY FROM  THE NECK DOWN.  Do not use on open wounds or open sores. Avoid contact with your eyes, ears, mouth and genitals (private parts). Wash Face and genitals (private parts)  with your normal soap.   Wash thoroughly, paying special attention to the area where your surgery will be performed.  Thoroughly rinse your body with warm water from the neck down.  DO NOT shower/wash with your normal soap after using and rinsing off the CHG Soap.  Pat yourself dry with a CLEAN TOWEL.  Wear CLEAN PAJAMAS to bed the night before surgery  Place CLEAN SHEETS on your bed the night before your surgery  DO NOT SLEEP WITH PETS.   Day of Surgery:  Take a shower with CHG soap. Wear Clean/Comfortable clothing the morning of surgery Do not apply any deodorants/lotions.   Remember to brush your teeth WITH YOUR REGULAR TOOTHPASTE.    If you received a COVID test during your pre-op visit, it is requested that you wear a mask when out in public, stay away from anyone that may not be feeling well, and notify your surgeon if you develop symptoms. If you have been in contact with anyone that has tested positive in the last 10 days, please notify your surgeon.    Please read over the following fact sheets that you were given.

## 2022-04-10 ENCOUNTER — Encounter (HOSPITAL_COMMUNITY)
Admission: RE | Admit: 2022-04-10 | Discharge: 2022-04-10 | Disposition: A | Discharge status: Home / Self Care | Payer: Medicaid Other | Source: Ambulatory Visit | Attending: Obstetrics & Gynecology | Admitting: Obstetrics & Gynecology

## 2022-04-10 ENCOUNTER — Other Ambulatory Visit: Payer: Self-pay

## 2022-04-10 ENCOUNTER — Encounter (HOSPITAL_COMMUNITY): Payer: Self-pay

## 2022-04-10 DIAGNOSIS — Z01812 Encounter for preprocedural laboratory examination: Secondary | ICD-10-CM | POA: Diagnosis present

## 2022-04-10 LAB — TYPE AND SCREEN
ABO/RH(D): O POS
Antibody Screen: NEGATIVE

## 2022-04-10 LAB — URINALYSIS, COMPLETE (UACMP) WITH MICROSCOPIC
Bilirubin Urine: NEGATIVE
Glucose, UA: NEGATIVE mg/dL
Hgb urine dipstick: NEGATIVE
Ketones, ur: NEGATIVE mg/dL
Leukocytes,Ua: NEGATIVE
Nitrite: NEGATIVE
Protein, ur: NEGATIVE mg/dL
Specific Gravity, Urine: 1.013 (ref 1.005–1.030)
pH: 5 (ref 5.0–8.0)

## 2022-04-10 LAB — CBC
HCT: 37.6 % (ref 36.0–46.0)
Hemoglobin: 12 g/dL (ref 12.0–15.0)
MCH: 27.9 pg (ref 26.0–34.0)
MCHC: 31.9 g/dL (ref 30.0–36.0)
MCV: 87.4 fL (ref 80.0–100.0)
Platelets: 243 10*3/uL (ref 150–400)
RBC: 4.3 MIL/uL (ref 3.87–5.11)
RDW: 14 % (ref 11.5–15.5)
WBC: 6.5 10*3/uL (ref 4.0–10.5)
nRBC: 0 % (ref 0.0–0.2)

## 2022-04-10 NOTE — Progress Notes (Signed)
PCP - No PCP Cardiologist - Denies  PPM/ICD - Denies  Chest x-ray - NI EKG - NI Stress Test - Denies ECHO - Denies Cardiac Cath - Denies  Sleep Study - Denies  DM - Denies  COVID TEST- NI   Anesthesia review:   Patient denies shortness of breath, fever, cough and chest pain at PAT appointment   All instructions explained to the patient, with a verbal understanding of the material. Patient agrees to go over the instructions while at home for a better understanding. The opportunity to ask questions was provided.

## 2022-04-17 ENCOUNTER — Ambulatory Visit (HOSPITAL_BASED_OUTPATIENT_CLINIC_OR_DEPARTMENT_OTHER): Payer: Medicaid Other | Admitting: Anesthesiology

## 2022-04-17 ENCOUNTER — Other Ambulatory Visit: Payer: Self-pay

## 2022-04-17 ENCOUNTER — Encounter (HOSPITAL_COMMUNITY): Payer: Self-pay | Admitting: Obstetrics & Gynecology

## 2022-04-17 ENCOUNTER — Observation Stay (HOSPITAL_COMMUNITY)
Admission: RE | Admit: 2022-04-17 | Discharge: 2022-04-18 | Disposition: A | Discharge status: Home / Self Care | Payer: Medicaid Other | Attending: Obstetrics & Gynecology | Admitting: Obstetrics & Gynecology

## 2022-04-17 ENCOUNTER — Encounter (HOSPITAL_COMMUNITY)
Admission: RE | Disposition: A | Discharge status: Home / Self Care | Payer: Self-pay | Source: Home / Self Care | Attending: Obstetrics & Gynecology

## 2022-04-17 ENCOUNTER — Ambulatory Visit (HOSPITAL_COMMUNITY): Payer: Medicaid Other | Admitting: Anesthesiology

## 2022-04-17 DIAGNOSIS — N72 Inflammatory disease of cervix uteri: Principal | ICD-10-CM | POA: Insufficient documentation

## 2022-04-17 DIAGNOSIS — N888 Other specified noninflammatory disorders of cervix uteri: Secondary | ICD-10-CM | POA: Diagnosis not present

## 2022-04-17 DIAGNOSIS — N393 Stress incontinence (female) (male): Secondary | ICD-10-CM | POA: Diagnosis not present

## 2022-04-17 DIAGNOSIS — N939 Abnormal uterine and vaginal bleeding, unspecified: Secondary | ICD-10-CM | POA: Diagnosis present

## 2022-04-17 DIAGNOSIS — N8003 Adenomyosis of the uterus: Secondary | ICD-10-CM | POA: Diagnosis not present

## 2022-04-17 DIAGNOSIS — Z9079 Acquired absence of other genital organ(s): Secondary | ICD-10-CM | POA: Diagnosis not present

## 2022-04-17 DIAGNOSIS — N920 Excessive and frequent menstruation with regular cycle: Secondary | ICD-10-CM | POA: Diagnosis present

## 2022-04-17 DIAGNOSIS — Z90710 Acquired absence of both cervix and uterus: Secondary | ICD-10-CM | POA: Diagnosis present

## 2022-04-17 HISTORY — PX: LAPAROSCOPIC VAGINAL HYSTERECTOMY WITH SALPINGECTOMY: SHX6680

## 2022-04-17 HISTORY — PX: CYSTOSCOPY: SHX5120

## 2022-04-17 HISTORY — PX: BLADDER SUSPENSION: SHX72

## 2022-04-17 LAB — POCT PREGNANCY, URINE: Preg Test, Ur: NEGATIVE

## 2022-04-17 SURGERY — HYSTERECTOMY, VAGINAL, LAPAROSCOPY-ASSISTED, WITH SALPINGECTOMY
Anesthesia: General | Site: Abdomen

## 2022-04-17 MED ORDER — BUPIVACAINE HCL (PF) 0.5 % IJ SOLN
INTRAMUSCULAR | Status: DC | PRN
  Administered 2022-04-17: 30 mL

## 2022-04-17 MED ORDER — HYDROMORPHONE HCL 1 MG/ML IJ SOLN
INTRAMUSCULAR | Status: AC
  Filled 2022-04-17: qty 1

## 2022-04-17 MED ORDER — HYDROMORPHONE HCL 1 MG/ML IJ SOLN: 0.2000 mg | INTRAMUSCULAR | Status: DC | PRN

## 2022-04-17 MED ORDER — SODIUM CHLORIDE 0.9% FLUSH
3.0000 mL | INTRAVENOUS | Status: DC | PRN
Start: 2022-04-18 — End: 2022-04-18

## 2022-04-17 MED ORDER — KETOROLAC TROMETHAMINE 30 MG/ML IJ SOLN
INTRAMUSCULAR | Status: DC | PRN
  Administered 2022-04-17: 30 mg via INTRAVENOUS

## 2022-04-17 MED ORDER — ONDANSETRON HCL 4 MG PO TABS: 4.0000 mg | ORAL_TABLET | Freq: Four times a day (QID) | ORAL | Status: DC | PRN

## 2022-04-17 MED ORDER — CEFAZOLIN SODIUM-DEXTROSE 2-3 GM-%(50ML) IV SOLR
INTRAVENOUS | Status: DC | PRN
  Administered 2022-04-17: 2 g via INTRAVENOUS

## 2022-04-17 MED ORDER — KETOROLAC TROMETHAMINE 30 MG/ML IJ SOLN
30.0000 mg | Freq: Four times a day (QID) | INTRAMUSCULAR | Status: AC
  Administered 2022-04-17 – 2022-04-18 (×3): 30 mg via INTRAVENOUS
  Filled 2022-04-17 (×3): qty 1

## 2022-04-17 MED ORDER — MIDAZOLAM HCL 2 MG/2ML IJ SOLN
INTRAMUSCULAR | Status: AC
  Filled 2022-04-17: qty 2

## 2022-04-17 MED ORDER — MIDAZOLAM HCL 2 MG/2ML IJ SOLN
INTRAMUSCULAR | Status: DC | PRN
  Administered 2022-04-17: 2 mg via INTRAVENOUS

## 2022-04-17 MED ORDER — ACETAMINOPHEN 500 MG PO TABS: 1000.0000 mg | ORAL_TABLET | ORAL | Status: AC

## 2022-04-17 MED ORDER — CHLORHEXIDINE GLUCONATE 0.12 % MT SOLN
OROMUCOSAL | Status: AC
  Administered 2022-04-17: 15 mL via OROMUCOSAL
  Filled 2022-04-17: qty 15

## 2022-04-17 MED ORDER — ACETAMINOPHEN 500 MG PO TABS
1000.0000 mg | ORAL_TABLET | Freq: Four times a day (QID) | ORAL | Status: DC
  Administered 2022-04-17 – 2022-04-18 (×2): 1000 mg via ORAL
  Filled 2022-04-17 (×3): qty 2

## 2022-04-17 MED ORDER — OXYBUTYNIN CHLORIDE ER 10 MG PO TB24
10.0000 mg | ORAL_TABLET | Freq: Every day | ORAL | Status: DC
  Administered 2022-04-18: 10 mg via ORAL
  Filled 2022-04-17 (×2): qty 1

## 2022-04-17 MED ORDER — ONDANSETRON HCL 4 MG/2ML IJ SOLN: 4.0000 mg | Freq: Four times a day (QID) | INTRAMUSCULAR | Status: DC | PRN

## 2022-04-17 MED ORDER — DIPHENHYDRAMINE HCL 50 MG/ML IJ SOLN: 25.0000 mg | Freq: Four times a day (QID) | INTRAMUSCULAR | Status: DC | PRN

## 2022-04-17 MED ORDER — PROPOFOL 10 MG/ML IV BOLUS
INTRAVENOUS | Status: DC | PRN
  Administered 2022-04-17: 150 mg via INTRAVENOUS

## 2022-04-17 MED ORDER — CEFAZOLIN SODIUM-DEXTROSE 1-4 GM/50ML-% IV SOLN
1.0000 g | Freq: Three times a day (TID) | INTRAVENOUS | Status: AC
  Administered 2022-04-17 – 2022-04-18 (×2): 1 g via INTRAVENOUS
  Filled 2022-04-17 (×3): qty 50

## 2022-04-17 MED ORDER — FENTANYL CITRATE (PF) 250 MCG/5ML IJ SOLN
INTRAMUSCULAR | Status: DC | PRN
  Administered 2022-04-17 (×2): 50 ug via INTRAVENOUS
  Administered 2022-04-17: 100 ug via INTRAVENOUS
  Administered 2022-04-17: 50 ug via INTRAVENOUS

## 2022-04-17 MED ORDER — PROPOFOL 10 MG/ML IV BOLUS
INTRAVENOUS | Status: AC
  Filled 2022-04-17: qty 20

## 2022-04-17 MED ORDER — ACETAMINOPHEN 10 MG/ML IV SOLN
INTRAVENOUS | Status: AC
  Filled 2022-04-17: qty 100

## 2022-04-17 MED ORDER — SUGAMMADEX SODIUM 200 MG/2ML IV SOLN
INTRAVENOUS | Status: DC | PRN
  Administered 2022-04-17: 200 mg via INTRAVENOUS

## 2022-04-17 MED ORDER — AMISULPRIDE (ANTIEMETIC) 5 MG/2ML IV SOLN
INTRAVENOUS | Status: AC
  Filled 2022-04-17: qty 4

## 2022-04-17 MED ORDER — EPHEDRINE SULFATE-NACL 50-0.9 MG/10ML-% IV SOSY
PREFILLED_SYRINGE | INTRAVENOUS | Status: DC | PRN
  Administered 2022-04-17: 5 mg via INTRAVENOUS

## 2022-04-17 MED ORDER — PROPOFOL 10 MG/ML IV BOLUS: INTRAVENOUS | Status: DC | PRN

## 2022-04-17 MED ORDER — LIDOCAINE-EPINEPHRINE 1 %-1:100000 IJ SOLN
INTRAMUSCULAR | Status: DC | PRN
  Administered 2022-04-17: 30 mL

## 2022-04-17 MED ORDER — ROCURONIUM BROMIDE 10 MG/ML (PF) SYRINGE
PREFILLED_SYRINGE | INTRAVENOUS | Status: DC | PRN
  Administered 2022-04-17: 70 mg via INTRAVENOUS
  Administered 2022-04-17: 30 mg via INTRAVENOUS

## 2022-04-17 MED ORDER — ACETAMINOPHEN 500 MG PO TABS
ORAL_TABLET | ORAL | Status: AC
  Administered 2022-04-17: 1000 mg via ORAL
  Filled 2022-04-17: qty 2

## 2022-04-17 MED ORDER — METHYLENE BLUE 1 % INJ SOLN
30.0000 mg | Freq: Once | Status: AC
  Administered 2022-04-17: 50 mg via INTRAVENOUS
  Filled 2022-04-17: qty 3

## 2022-04-17 MED ORDER — LACTATED RINGERS IV SOLN: INTRAVENOUS | Status: DC

## 2022-04-17 MED ORDER — ACETAMINOPHEN 10 MG/ML IV SOLN
INTRAVENOUS | Status: DC | PRN
  Administered 2022-04-17: 1000 mg via INTRAVENOUS

## 2022-04-17 MED ORDER — POVIDONE-IODINE 10 % EX SWAB
2.0000 "application " | Freq: Once | CUTANEOUS | Status: AC
  Administered 2022-04-17: 2 via TOPICAL

## 2022-04-17 MED ORDER — HYDROMORPHONE HCL 1 MG/ML IJ SOLN
0.2500 mg | INTRAMUSCULAR | Status: DC | PRN
  Administered 2022-04-17 (×4): 0.5 mg via INTRAVENOUS

## 2022-04-17 MED ORDER — SODIUM CHLORIDE 0.9 % IV SOLN: 250.0000 mL | INTRAVENOUS | Status: DC | PRN

## 2022-04-17 MED ORDER — LIDOCAINE 2% (20 MG/ML) 5 ML SYRINGE
INTRAMUSCULAR | Status: DC | PRN
  Administered 2022-04-17: 60 mg via INTRAVENOUS

## 2022-04-17 MED ORDER — MENTHOL 3 MG MT LOZG: 1.0000 | LOZENGE | OROMUCOSAL | Status: DC | PRN

## 2022-04-17 MED ORDER — METHYLENE BLUE 1 % INJ SOLN
INTRAVENOUS | Status: AC
  Filled 2022-04-17: qty 10

## 2022-04-17 MED ORDER — CHLORHEXIDINE GLUCONATE 0.12 % MT SOLN
15.0000 mL | Freq: Once | OROMUCOSAL | Status: AC
Start: 2022-04-17 — End: 2022-04-17

## 2022-04-17 MED ORDER — AMISULPRIDE (ANTIEMETIC) 5 MG/2ML IV SOLN
10.0000 mg | Freq: Once | INTRAVENOUS | Status: AC
  Administered 2022-04-17: 10 mg via INTRAVENOUS

## 2022-04-17 MED ORDER — ONDANSETRON HCL 4 MG/2ML IJ SOLN
INTRAMUSCULAR | Status: DC | PRN
  Administered 2022-04-17: 4 mg via INTRAVENOUS

## 2022-04-17 MED ORDER — SODIUM CHLORIDE 0.9% FLUSH: 3.0000 mL | Freq: Two times a day (BID) | INTRAVENOUS | Status: DC

## 2022-04-17 MED ORDER — LIDOCAINE-EPINEPHRINE 1 %-1:100000 IJ SOLN
INTRAMUSCULAR | Status: AC
  Filled 2022-04-17: qty 1

## 2022-04-17 MED ORDER — FLUORESCEIN SODIUM 10 % IV SOLN
INTRAVENOUS | Status: AC
  Filled 2022-04-17: qty 5

## 2022-04-17 MED ORDER — BUPIVACAINE HCL (PF) 0.25 % IJ SOLN: INTRAMUSCULAR | Status: DC | PRN

## 2022-04-17 MED ORDER — GABAPENTIN 100 MG PO CAPS: 100.0000 mg | ORAL_CAPSULE | Freq: Three times a day (TID) | ORAL | Status: DC | PRN

## 2022-04-17 MED ORDER — PANTOPRAZOLE SODIUM 40 MG IV SOLR
40.0000 mg | Freq: Every day | INTRAVENOUS | Status: DC
  Administered 2022-04-17: 40 mg via INTRAVENOUS
  Filled 2022-04-17: qty 10

## 2022-04-17 MED ORDER — LIDOCAINE HCL (PF) 1 % IJ SOLN
INTRAMUSCULAR | Status: DC | PRN
  Administered 2022-04-17: 30 mL

## 2022-04-17 MED ORDER — LIDOCAINE HCL (PF) 1 % IJ SOLN
INTRAMUSCULAR | Status: AC
  Filled 2022-04-17: qty 30

## 2022-04-17 MED ORDER — ORAL CARE MOUTH RINSE: 15.0000 mL | Freq: Once | OROMUCOSAL | Status: AC

## 2022-04-17 MED ORDER — SIMETHICONE 80 MG PO CHEW: 80.0000 mg | CHEWABLE_TABLET | Freq: Four times a day (QID) | ORAL | Status: DC | PRN

## 2022-04-17 MED ORDER — ALUM & MAG HYDROXIDE-SIMETH 200-200-20 MG/5ML PO SUSP: 30.0000 mL | ORAL | Status: DC | PRN

## 2022-04-17 MED ORDER — DEXAMETHASONE SODIUM PHOSPHATE 10 MG/ML IJ SOLN
INTRAMUSCULAR | Status: DC | PRN
  Administered 2022-04-17: 10 mg via INTRAVENOUS

## 2022-04-17 MED ORDER — OXYCODONE HCL 5 MG PO TABS
5.0000 mg | ORAL_TABLET | ORAL | Status: DC | PRN
  Administered 2022-04-17 – 2022-04-18 (×2): 5 mg via ORAL
  Administered 2022-04-18: 10 mg via ORAL
  Filled 2022-04-17: qty 2
  Filled 2022-04-17 (×2): qty 1

## 2022-04-17 MED ORDER — FENTANYL CITRATE (PF) 250 MCG/5ML IJ SOLN
INTRAMUSCULAR | Status: AC
  Filled 2022-04-17: qty 5

## 2022-04-17 MED ORDER — BUPIVACAINE HCL (PF) 0.5 % IJ SOLN
INTRAMUSCULAR | Status: AC
  Filled 2022-04-17: qty 30

## 2022-04-17 MED ORDER — ZOLPIDEM TARTRATE 5 MG PO TABS: 5.0000 mg | ORAL_TABLET | Freq: Every evening | ORAL | Status: DC | PRN

## 2022-04-17 MED ORDER — IBUPROFEN 600 MG PO TABS: 600.0000 mg | ORAL_TABLET | Freq: Four times a day (QID) | ORAL | Status: DC

## 2022-04-17 SURGICAL SUPPLY — 58 items
BLADE 11 SAFETY STRL DISP (BLADE) ×4 IMPLANT
BLADE 15 SAFETY STRL DISP (BLADE) ×4 IMPLANT
BLADE SURG 11 STRL SS (BLADE) ×4 IMPLANT
CANISTER SUCT 3000ML PPV (MISCELLANEOUS) ×4 IMPLANT
CATH FOLEY 2WAY SLVR 18FR 30CC (CATHETERS) ×4 IMPLANT
COVER BACK TABLE 60X90IN (DRAPES) ×4 IMPLANT
COVER MAYO STAND STRL (DRAPES) ×4 IMPLANT
COVER SURGICAL LIGHT HANDLE (MISCELLANEOUS) ×4 IMPLANT
DECANTER SPIKE VIAL GLASS SM (MISCELLANEOUS) ×5 IMPLANT
DEFOGGER SCOPE WARMER CLEARIFY (MISCELLANEOUS) ×4 IMPLANT
DERMABOND ADVANCED (GAUZE/BANDAGES/DRESSINGS) ×1
DERMABOND ADVANCED .7 DNX12 (GAUZE/BANDAGES/DRESSINGS) ×3 IMPLANT
DRSG OPSITE POSTOP 3X4 (GAUZE/BANDAGES/DRESSINGS) ×4 IMPLANT
DURAPREP 26ML APPLICATOR (WOUND CARE) ×4 IMPLANT
ELECT REM PT RETURN 9FT ADLT (ELECTROSURGICAL) ×4
ELECTRODE REM PT RTRN 9FT ADLT (ELECTROSURGICAL) ×3 IMPLANT
FILTER SMOKE EVAC LAPAROSHD (FILTER) ×4 IMPLANT
GLOVE BIO SURGEON STRL SZ 6.5 (GLOVE) ×8 IMPLANT
GLOVE BIO SURGEON STRL SZ7 (GLOVE) ×8 IMPLANT
GLOVE BIOGEL PI IND STRL 7.0 (GLOVE) ×6 IMPLANT
GLOVE BIOGEL PI INDICATOR 7.0 (GLOVE) ×2
GLOVE SURG SS PI 7.0 STRL IVOR (GLOVE) ×1 IMPLANT
GLOVE SURG UNDER POLY LF SZ7 (GLOVE) ×5 IMPLANT
GOWN STRL REUS W/ TWL LRG LVL3 (GOWN DISPOSABLE) ×6 IMPLANT
GOWN STRL REUS W/TWL LRG LVL3 (GOWN DISPOSABLE) ×8
HEMOSTAT ARISTA ABSORB 3G PWDR (HEMOSTASIS) ×1 IMPLANT
KIT TURNOVER KIT B (KITS) ×4 IMPLANT
LIGASURE VESSEL 5MM BLUNT TIP (ELECTROSURGICAL) ×1 IMPLANT
MANIPULATOR ADVINCU DEL 3.5 PL (MISCELLANEOUS) ×1 IMPLANT
NEEDLE HYPO 22GX1.5 SAFETY (NEEDLE) ×4 IMPLANT
NS IRRIG 1000ML POUR BTL (IV SOLUTION) ×4 IMPLANT
PACK LAVH (CUSTOM PROCEDURE TRAY) ×4 IMPLANT
PACK ROBOTIC GOWN (GOWN DISPOSABLE) ×4 IMPLANT
PACK TRENDGUARD 450 HYBRID PRO (MISCELLANEOUS) IMPLANT
PACK VAGINAL WOMENS (CUSTOM PROCEDURE TRAY) ×4 IMPLANT
POUCH LAPAROSCOPIC INSTRUMENT (MISCELLANEOUS) ×4 IMPLANT
PROTECTOR NERVE ULNAR (MISCELLANEOUS) ×8 IMPLANT
SET CYSTO W/LG BORE CLAMP LF (SET/KITS/TRAYS/PACK) ×4 IMPLANT
SET IRRIG TUBING LAPAROSCOPIC (IRRIGATION / IRRIGATOR) ×1 IMPLANT
SET TUBE SMOKE EVAC HIGH FLOW (TUBING) ×4 IMPLANT
SLEEVE XCEL OPT CAN 5 100 (ENDOMECHANICALS) ×1 IMPLANT
SLING TVT EXACT (Sling) ×4 IMPLANT
SPONGE T-LAP 18X18 ~~LOC~~+RFID (SPONGE) ×4 IMPLANT
SUT MNCRL AB 3-0 PS2 27 (SUTURE) ×4 IMPLANT
SUT MON AB 4-0 PS1 27 (SUTURE) ×4 IMPLANT
SUT VIC AB 0 CT1 27 (SUTURE) ×28
SUT VIC AB 0 CT1 27XBRD ANBCTR (SUTURE) ×6 IMPLANT
SUT VIC AB 0 CT1 27XCR 8 STRN (SUTURE) ×6 IMPLANT
SUT VIC AB 2-0 CT1 (SUTURE) ×8 IMPLANT
SUT VICRYL 0 TIES 12 18 (SUTURE) ×4 IMPLANT
SUT VICRYL 0 UR6 27IN ABS (SUTURE) ×8 IMPLANT
SYR BULB IRRIG 60ML STRL (SYRINGE) ×4 IMPLANT
TOWEL GREEN STERILE FF (TOWEL DISPOSABLE) ×8 IMPLANT
TRAY FOLEY W/BAG SLVR 14FR (SET/KITS/TRAYS/PACK) ×4 IMPLANT
TRENDGUARD 450 HYBRID PRO PACK (MISCELLANEOUS) ×4
TROCAR XCEL NON-BLD 11X100MML (ENDOMECHANICALS) ×4 IMPLANT
TROCAR XCEL NON-BLD 5MMX100MML (ENDOMECHANICALS) ×4 IMPLANT
UNDERPAD 30X36 HEAVY ABSORB (UNDERPADS AND DIAPERS) ×4 IMPLANT

## 2022-04-17 NOTE — Anesthesia Preprocedure Evaluation (Signed)
Anesthesia Evaluation  Patient identified by MRN, date of birth, ID band Patient awake    Reviewed: Allergy & Precautions, H&P , NPO status , Patient's Chart, lab work & pertinent test results  Airway Mallampati: II  TM Distance: >3 FB Neck ROM: Full    Dental no notable dental hx. (+) Teeth Intact, Dental Advisory Given   Pulmonary neg pulmonary ROS,    Pulmonary exam normal breath sounds clear to auscultation       Cardiovascular negative cardio ROS   Rhythm:Regular Rate:Normal     Neuro/Psych negative neurological ROS  negative psych ROS   GI/Hepatic negative GI ROS, Neg liver ROS,   Endo/Other  Morbid obesity  Renal/GU negative Renal ROS  negative genitourinary   Musculoskeletal   Abdominal   Peds  Hematology  (+) Blood dyscrasia, anemia ,   Anesthesia Other Findings   Reproductive/Obstetrics negative OB ROS                             Anesthesia Physical Anesthesia Plan  ASA: 2  Anesthesia Plan: General   Post-op Pain Management: Tylenol PO (pre-op)* and Toradol IV (intra-op)*   Induction: Intravenous  PONV Risk Score and Plan: 4 or greater and Ondansetron, Dexamethasone and Midazolam  Airway Management Planned: Oral ETT  Additional Equipment:   Intra-op Plan:   Post-operative Plan: Extubation in OR  Informed Consent: I have reviewed the patients History and Physical, chart, labs and discussed the procedure including the risks, benefits and alternatives for the proposed anesthesia with the patient or authorized representative who has indicated his/her understanding and acceptance.     Dental advisory given  Plan Discussed with: CRNA  Anesthesia Plan Comments:         Anesthesia Quick Evaluation

## 2022-04-17 NOTE — Anesthesia Postprocedure Evaluation (Signed)
Anesthesia Post Note  Patient: Sharon Thornton  Procedure(s) Performed: LAPAROSCOPIC ASSISTED VAGINAL HYSTERECTOMY (Abdomen) TRANSVAGINAL TAPE (TVT) PROCEDURE CYSTOSCOPY     Patient location during evaluation: PACU Anesthesia Type: General Level of consciousness: awake and alert Pain management: pain level controlled Vital Signs Assessment: post-procedure vital signs reviewed and stable Respiratory status: spontaneous breathing, nonlabored ventilation and respiratory function stable Cardiovascular status: blood pressure returned to baseline and stable Postop Assessment: no apparent nausea or vomiting Anesthetic complications: no   No notable events documented.  Last Vitals:  Vitals:   04/17/22 1725 04/17/22 1740  BP: 105/62 100/74  Pulse: 90 68  Resp: 13 10  Temp:    SpO2: 98% 97%    Last Pain:  Vitals:   04/17/22 1740  TempSrc:   PainSc: 4                  Taytum Scheck,W. EDMOND

## 2022-04-17 NOTE — Transfer of Care (Signed)
Immediate Anesthesia Transfer of Care Note  Patient: Sharon Thornton  Procedure(s) Performed: LAPAROSCOPIC ASSISTED VAGINAL HYSTERECTOMY (Abdomen) TRANSVAGINAL TAPE (TVT) PROCEDURE CYSTOSCOPY  Patient Location: PACU  Anesthesia Type:General  Level of Consciousness: awake, alert  and oriented  Airway & Oxygen Therapy: Patient Spontanous Breathing  Post-op Assessment: Report given to RN and Post -op Vital signs reviewed and stable  Post vital signs: Reviewed and stable  Last Vitals:  Vitals Value Taken Time  BP    Temp    Pulse    Resp    SpO2      Last Pain:  Vitals:   04/17/22 1026  TempSrc: Oral  PainSc: 0-No pain         Complications: No notable events documented.

## 2022-04-17 NOTE — H&P (Signed)
MD GYN HISTORY AND PHYSICAL  Admission Date: 04/17/2022 10:10 AM  Admit Diagnosis: Menorrhagia, stress urinary incontinence Patient Name: Sharon Thornton        MRN#: [id number]  Subjective:    Patient is a 36 y.o. female [id number] presents for scheduled Laparoscopic assisted vaginal hysterectomy, tension free vaginal tape, cystoscopy.  The indications for procedure are patient has worsening leaking of urine with increased abdominal pressure (ie walking, cough, laugh) and it is affecting the quality of her life. Patient is s/p endometrial ablation October 2022 for abnormal uterine bleeding. However, she notes her heavy bleeding only worsened after the procedure. Hence, she has opted for definitive surgery.    Pertinent Gynecological History: Menses:flow is excessive with use of 10 pads or tampons on heaviest days  Bleeding: dysfunctional uterine bleeding Contraception:  bilateral salpingectomy DES exposure: denies Blood transfusions: none Sexually transmitted diseases: no past history Previous GYN Procedures: DNC and hysteroscopy / endometrial ablation   Last mammogram:  N/A   Last pap: normal Date: 07/26/21 OB History: G3, P3   Menstrual History: Menarche age: 44   Medical / Surgical History: Past medical history:  Past Medical History:  Diagnosis Date   Anemia    Family history of breast cancer    Family history of colon cancer    Family history of prostate cancer    No pertinent past medical history     Past surgical history:  Past Surgical History:  Procedure Laterality Date   DILITATION & CURRETTAGE/HYSTROSCOPY WITH NOVASURE ABLATION N/A 09/01/2021   Procedure: DILATATION & CURETTAGE/HYSTEROSCOPY WITH NOVASURE ABLATION;  Surgeon: Sanjuana Kava, MD;  Location: [place];  Service: Gynecology;  Laterality: N/A;   HYSTEROSCOPY N/A 07/10/2021   Procedure: HYSTEROSCOPY;  Surgeon: Sanjuana Kava, MD;  Location: [facility];  Service: Gynecology;  Laterality: N/A;   IUD  REMOVAL  07/10/2021   Procedure: INTRAUTERINE DEVICE (IUD) REMOVAL;  Surgeon: Sanjuana Kava, MD;  Location: [place];  Service: Gynecology;;   LAPAROSCOPIC BILATERAL SALPINGECTOMY Bilateral 09/01/2021   Procedure: LAPAROSCOPIC BILATERAL SALPINGECTOMY;  Surgeon: Sanjuana Kava, MD;  Location: [place];  Service: Gynecology;  Laterality: Bilateral;   WISDOM TOOTH EXTRACTION     WRIST SURGERY Right    x2   Family History:  Family History  Problem Relation Age of Onset   Heart disease Paternal Uncle    Diabetes Maternal Grandmother    Hypertension Maternal Grandmother    Diabetes Maternal Grandfather    Heart disease Maternal Grandfather    Hypertension Maternal Grandfather    Diabetes Paternal Grandmother    Colon cancer Paternal Grandmother 70   Breast cancer Paternal Grandmother        diagnosed in her 86s or 54s   Colon cancer Father 44       stage 3 at dignosis    Social History:  reports that she has never smoked. She has never used smokeless tobacco. She reports current alcohol use. She reports that she does not use drugs.  Allergies: No Known Allergies   Current Medications at time of admission:  Prior to Admission medications   Medication Sig Start Date End Date Taking? Authorizing Provider  ibuprofen (ADVIL) 600 MG tablet take 1 tablet po pc every 6 hours for 5 days then as needed for post operative pain Patient taking differently: Take 600 mg by mouth every 4 (four) hours as needed for mild pain. 09/01/21  Yes Earnstine Regal, PA-C  oxyCODONE-acetaminophen (PERCOCET/ROXICET) 5-325 MG tablet take 1 tablet  po every 6 hours as needed for breakthrough post operative pain Patient not taking: Reported on 04/10/2022 09/01/21   Earnstine Regal, PA-C    Review of Systems: Constitutional: Negative   HENT: Negative   Eyes: Negative   Respiratory: Negative   Cardiovascular: Negative   Gastrointestinal: Negative  Genitourinary: Pos for vaginal bleeding   Musculoskeletal:  Negative   Skin: Negative   Neurological: Negative   Endo/Heme/Allergies: Negative   Psychiatric/Behavioral: Negative      Objective:     Physical Exam: VS: Blood pressure 119/72, pulse 63, temperature 97.9 F (36.6 C), temperature source Oral, resp. rate 18, height '5\' 7"'$  (1.702 m), weight 108.9 kg, last menstrual period 04/04/2022, SpO2 98 %. Physical Exam General:   alert, cooperative, and no distress  Skin:   normal  Lungs:   clear to auscultation bilaterally  Heart:   regular rate and rhythm, S1, S2 normal, no murmur, click, rub or gallop  Abdomen:  soft, non-tender; bowel sounds normal; no masses,  no organomegaly  Pelvis:  Exam deferred to OR   Labs / Imaging: Results for orders placed or performed during the hospital encounter of 04/17/22 (from the past 24 hour(s))  Pregnancy, urine POC     Status: None   Collection Time: 04/17/22 10:53 AM  Result Value Ref Range   Preg Test, Ur NEGATIVE NEGATIVE   CBC    Component Value Date/Time   WBC 6.5 04/10/2022 1550   RBC 4.30 04/10/2022 1550   HGB 12.0 04/10/2022 1550   HCT 37.6 04/10/2022 1550   PLT 243 04/10/2022 1550   MCV 87.4 04/10/2022 1550   MCH 27.9 04/10/2022 1550   MCHC 31.9 04/10/2022 1550   RDW 14.0 04/10/2022 1550   LYMPHSABS 1.3 09/01/2021 1150   MONOABS 0.3 09/01/2021 1150   EOSABS 0.2 09/01/2021 1150   BASOSABS 0.1 09/01/2021 1150     --Type and Screen: O POS (05/30 1547)   Assessment:        Patient is a 36 y.o. y.o [id number] with diagnosis of  1. Abnormal uterine bleeding 2. Urinary stress incontinence        Plan:    On call to OR for LAVH, TVT, Cystoscopy  Ancef 2GM IV preop prophylaxis  The risks of a hysterectomy were discussed with the patient to include, but not limited to bleeding possibly requiring a transfusion, infection, wound breakdown or poor healing, damage to organs in the abdomen /pelvis w/ possible need for further surgical repair (like bowel or bladder injury), need for large  abdominal incision to complete procedure, blood clots, PE/MI/stroke. We also discussed possible diagnosis of malignancy once the Pathology report returns requiring further treatment or surgery. We also discussed ovarian preservation. She was counseled about the 1/70 lifetime risk of ovarian cancer and 5-10% risk for future ovarian surgery for any ovarian problems in the future. PT desires preservation of her ovaries.  We discussed the risks, benefits and alternative to a mid-urethral sling to include: Conservative management (Kegel exercises, pelvic floor rehabilitation / physical therapy, and pessaries)  Sling is "tension-free", however some women experience immediate urinary retention requiring a foley leg bag at time of discharge.  Also small possibility of permanent urinary retention.  We discussed possible recurrence of symptoms,  damage to bladder, and 5% risk of mesh erosion through vagina possibly necessitating further surgical correction  Reviewed the potential hospital course and recovery and pain management. Medication reconciliation        performed to ensure her home  meds will be given during her hospital stay.   All the above was reviewed in detail and questions placed by patient answered. Patient voiced        understanding and consents to surgery.   Patient will follow up in office one week after surgery for incision check then four weeks for a full           postoperative examination including a pelvic exam.    Consent was signed, witnessed and placed into chart.   Sanjuana Kava MD 04/17/2022, 11:42 AM

## 2022-04-17 NOTE — Anesthesia Procedure Notes (Signed)
Procedure Name: Intubation Date/Time: 04/17/2022 1:28 PM Performed by: Griffin Dakin, CRNA Pre-anesthesia Checklist: Patient identified, Emergency Drugs available, Suction available and Patient being monitored Patient Re-evaluated:Patient Re-evaluated prior to induction Oxygen Delivery Method: Circle system utilized Preoxygenation: Pre-oxygenation with 100% oxygen Induction Type: IV induction Ventilation: Mask ventilation without difficulty Laryngoscope Size: Mac and 3 Grade View: Grade I Tube type: Oral Tube size: 7.0 mm Number of attempts: 1 Airway Equipment and Method: Stylet and Oral airway Placement Confirmation: ETT inserted through vocal cords under direct vision, positive ETCO2 and breath sounds checked- equal and bilateral Secured at: 23 cm Tube secured with: Tape Dental Injury: Teeth and Oropharynx as per pre-operative assessment

## 2022-04-18 ENCOUNTER — Encounter (HOSPITAL_COMMUNITY): Payer: Self-pay | Admitting: Obstetrics & Gynecology

## 2022-04-18 DIAGNOSIS — N72 Inflammatory disease of cervix uteri: Secondary | ICD-10-CM | POA: Diagnosis not present

## 2022-04-18 LAB — CBC
HCT: 32.1 % — ABNORMAL LOW (ref 36.0–46.0)
Hemoglobin: 10.5 g/dL — ABNORMAL LOW (ref 12.0–15.0)
MCH: 28 pg (ref 26.0–34.0)
MCHC: 32.7 g/dL (ref 30.0–36.0)
MCV: 85.6 fL (ref 80.0–100.0)
Platelets: 254 10*3/uL (ref 150–400)
RBC: 3.75 MIL/uL — ABNORMAL LOW (ref 3.87–5.11)
RDW: 13.8 % (ref 11.5–15.5)
WBC: 9 10*3/uL (ref 4.0–10.5)
nRBC: 0 % (ref 0.0–0.2)

## 2022-04-18 LAB — BASIC METABOLIC PANEL
Anion gap: 6 (ref 5–15)
BUN: 7 mg/dL (ref 6–20)
CO2: 22 mmol/L (ref 22–32)
Calcium: 8.7 mg/dL — ABNORMAL LOW (ref 8.9–10.3)
Chloride: 109 mmol/L (ref 98–111)
Creatinine, Ser: 0.86 mg/dL (ref 0.44–1.00)
GFR, Estimated: >60 mL/min (ref >60)
Glucose, Bld: 118 mg/dL — ABNORMAL HIGH (ref 70–99)
Potassium: 4.5 mmol/L (ref 3.5–5.1)
Sodium: 137 mmol/L (ref 135–145)

## 2022-04-18 MED ORDER — NITROFURANTOIN MONOHYD MACRO 100 MG PO CAPS: 100.0000 mg | ORAL_CAPSULE | Freq: Two times a day (BID) | ORAL | 0 refills | Status: AC

## 2022-04-18 MED ORDER — OXYCODONE-ACETAMINOPHEN 5-325 MG PO TABS: ORAL_TABLET | ORAL | 0 refills | Status: AC

## 2022-04-18 MED ORDER — IBUPROFEN 600 MG PO TABS: 600.0000 mg | ORAL_TABLET | Freq: Four times a day (QID) | ORAL | 3 refills | Status: AC | PRN

## 2022-04-18 MED ORDER — OXYBUTYNIN CHLORIDE ER 10 MG PO TB24
10.0000 mg | ORAL_TABLET | Freq: Once | ORAL | 3 refills | Status: AC | PRN
Start: 2022-04-18 — End: ?

## 2022-04-18 MED ORDER — GABAPENTIN 100 MG PO CAPS: 100.0000 mg | ORAL_CAPSULE | Freq: Two times a day (BID) | ORAL | 3 refills | Status: AC | PRN

## 2022-04-18 NOTE — Op Note (Signed)
OPERATIVE NOTE  Sharon Thornton  DOB:    [birth year]  MRN:    [id number]  CSN:    [id number]  Date of Surgery:  04/17/2022  Preoperative Diagnosis: Abnormal uterine bleeding (menorrhagia) Urinary stress incontinence  Postoperative Diagnosis: Same as above  Procedure (s): Laparoscopy Assisted Vaginal Hysterectomy Tension free vaginal tape Urethrocystoscopy  Surgeon: Mady Haagensen. Kaziyah Parkison MD  Assistant: Everett Graff, MD  Anesthetic: General endotracheal Local anesthesia   Estimated Blood Loss:  300 mL         Drains: Foley during case  UOP: 200 mL clear urine         Total IV Fluids: 1000 ml  Blood Given: none          Specimens: Uterus with cervix         Implants: Gynecare TVT        Complications:  None         Disposition: PACU - hemodynamically stable.         Condition: stable  Disposition: The patient presents with the above-mentioned diagnosis. She understands the indications for surgical procedure.  She also understands the alternative treatment options. She accepts the risk of, but not limited to, anesthetic complications, bleeding, infections, and possible damage to the surrounding organs.  Indication: 36 yo with long standing history of abnormal uterine bleeding. Failed endometrial ablation and conservative management with hormone medication.    Findings: Laparoscopy: Enlarged 12 week size globular appearing uterus.  Normal bilateral ovaries.  Fallopian tubes absent. Normal appearing appendix and liver edge; cystoscopy: normal urothelium brisk bilateral jets seen from both ureteral orifices  Procedure:  The patient was brought into the operating room with an intravenous line in placed, and anesthetic was administered. She was placed in a low dorsal lithotomy position using Allen stirrups. The patient's  abdomen was prepped with ChloraPrep. The perineum and vagina were prepped with multiple layers of Betadine. A Time out was performed identifying patient and  procedure.  An indwelling foley catheter was placed in the bladder.  A  weighted speculum was placed in the posterior vagina and the anterior vagina was retracted with a Deaver retractor. The anterior lip of the cervix was held with a single tooth tenaculum. The uterus was sounded to 9cm  and dilated with [facility] dilators.  3.5 cm Advincula uterine manipulator was placed and the intrauterine balloon inflated. The  Speculum was removed, gloves and gown changed and attention was turned to the abdomen.   The patient was sterilely draped. The subumbilical area was injected with 1% Lidocaine.  An incision was made in the umbilicus with 11 blade scalpel, an 11 mm Visiport trocar was used with 10 mm 0 degree laparoscope attached to perform direct entry into the patient's abdomen. Direct video visualization confirmed entry into the abdomen and pneumoperitoneum was obtained using approximately 3L CO2 gas. The patient was placed in steep Trendelenburg position. A small incision was made and a 5 mm trocar was inserted into the abdominal cavity along the right pelvis under direct visualization.  Another two 5 mm  trocars were placed: one in the lower left abdomen and left mid abdomen. There was no injury noted with placement of trocars.  The pelvic contents were visualized and findings noted above, both ureters were identified crossing the pelvic brim and pelvic sidewall coursing well below operative field.  Pictures were taken of the patient's pelvic structures.    The left round ligament was identified, cauterized and transected using the 5 mm  Ligasure. The left utero-ovarian ligament was then cauterized and cut using the Ligasure. The anterior broad ligament was entered and the  bladder flap was developed anteriorly. Again on the contralateral side the right round ligament and right utero-ovarian ligament was cauterized and transected with the Ligasure. The bladder flap was developed further and the bladder pushed caudad.  The uterine arteries were cauterized bilaterally and cut.  Hemostasis was assured.    At this point we felt we were ready to proceed with the vaginal portion of the procedure. The patient was placed in a more lithotomy position. A weighted speculum was placed in the posterior vagina. The cervix was injected with 1% Lidocaine with epinephrine. A circumferential incision was made around the cervix using the Bovie.  The vaginal mucosa was advanced anteriorly and posteriorly. The anterior cul-de-sac and in the posterior cul-de-sac were sharply entered. Alternating from right to left the uterosacral ligaments, cardinal ligaments were clamped (with Heaney clamps) cut, sutured, and tied securely using #0 Vicryl.  The uterosacral ligament sutures were tagged and left long. The uterine fundus was then grasped using a single toothed tenaculum and developed (Dderlein maneuver). The lower cardinal ligaments were exposed by pulling the cervix and fundus uteri to the contralateral side. The remaining cardinal ligaments and parametrial tissues were clamped and suture ligated with 0-Vicryl then a free tie placed on the pedicle.   The uterus was removed from the operative field. Hemostasis was confirmed. The sutures attached to the uterosacral ligaments were brought out through the vaginal angles and then tied securely. A McCall culdoplasty suture was placed in the posterior cul-de-sac incorporating the uterosacral ligaments bilaterally and the posterior peritoneum. A final check was made for hemostasis and again hemostasis was confirmed. The vaginal cuff was closed using figure-of-eight sutures incorporating the anterior vaginal mucosa, the anterior peritoneum, posterior peritoneum, and the posterior vaginal mucosa. The McCall culdoplasty suture was tied securely and the apex of the vagina was noted to elevate into the midpelvis.   A cystoscopy was then performed using a 70 degree cystoscopy and a complete bladder survey was  performed. There was no noted injury to the bladder urothelium and there were brisk jets from both ureteral orifices.     The 37 French Foley catheter was already in place. The exit points were marked 2 cm on each side of the midline immediately above the pubic symphysis.30 cc of diluted 0.25 % Marcaine  local anesthesia was injected using a spinal needle into the exit sites and passing the needle along the back of the pubic symphysis until the needle touched the endopelvic fascia.   1% lidocaine with epinephrine was injected submucosally at the level of the mid urethra, creating a space between the vaginal wall and the periurethral fascia. A sagittal incision no more than 1.5 cm long starting at 1.0 cm cephalad from the urethral meatus was performed using a 15 blade scalpel. Two 0.5 cm paraurethral lateral dissections were performed using Metzenbaum scissors to allow the subsequent passage of the implant.   The bladder was confirmed to be empty and the GYNECARE TVT reusable rigid catheter guide was inserted into the channel of the 18 French Foley catheter. The tip of foley catheter was pushed gently toward the posterior lateral wall of the bladder opposite to the intended trocar sheath passage in order to displace the bladder to the contralateral side and prevent injury.  The trocar sheath was secured to the Trocar Handle by hooking the trocar Sheath Cut-out onto the  trocar sheath Lock on the Trocar Handle.  The trocar shaft was placed inside one of the two white trocar sheaths, using the dominant hand to hold the trocar handle and the  non-dominant hand to control initial insertion of the device by placing index finger under the anterior vaginal wall, just lateral of the suburethral incision. The trocar sheath tip was oriented horizontally in the frontal plane during the initial submucosal passage in the paraurethral dissected space and the tip of the trocar sheath passed through until it reached the end of  the dissected space.  The urogenital diaphragm was perforated and the trocar handle lowered to ensure that the trocar sheath tip passed vertically, staying in close contact with the back of the pubic symphysis. The trocar sheath tip was advance through the urogenital diaphragm into the retropubic space and aimed towards the pre-marked abdominal exit sites.    The tip was advanced through the retropubic space, the rectus muscle and skin and then grasped with a Kelly clamp. The trocar Sheath was pushed laterally off the lock and the needle / shaft was withdrawn. The 18 french foley catheter was removed and a cystoscopy was performed to confirm bladder integrity.   The above procedure was repeated on the contralateral side. The trocar Sheaths were gently pulled upwards to bring the sling loosely under the mid urethra. The sling was carefully positioned into place. Care was taken to insure that the sling was not twisted in any way, and the sheaths were removed.  Mayo scissors were placed between the urethra and the sling itself during tensioning of the sling to insure proper placement and that it was in fact tension free. The sling was then cut to the level of the skin, the skin edges were lifted up to ensure that mesh was subdermal and the skin was closed using Dermabond.  Arista was placed in the vaginal mucosa for hemostasis and the vaginal mucosa was then closed using 2-0 Vicryl in a running, locked stitch and excellent hemostasis was achieved.    Surgeon then changed gown and gloves. The pneumoperitoneum was reestablished. The pelvis was inspected and hemostasis was adequate. The pelvis was irrigated. We felt that we are ready to end the procedure. The 5 mm trochars were removed under direct visualization. The pneumoperitoneum was allowed to escape. The subumbilical trocar was removed. The subumbilical incision was closed using a deep suture of 0 Vicryl followed by skin closure of 3-0 Monocryl. Dermabond was  used to cover each incision site and op site placed over umbilical incision.    The patient tolerated her procedure well. She was awakened from her anesthetic without difficulty and then transported to the recovery room in stable condition. Sponge, needle, and instrument counts were correct on 3 occasions.   Sanjuana Kava    Attending Attestation: I PERFORMED THE PROCEDURE AND MY ASSISTANT WAS NEEDED FOR THE COMPLEXITY OF THE CASE

## 2022-04-18 NOTE — Progress Notes (Signed)
MD POST OP PROGRESS NOTE  Sharon Thornton is a57 y.o.  [id number]  Post Op Date # 1  Subjective: Patient notes she is doing ok, has some incisional pain.  She has been out of bed, voiding, tolerating po without difficulty.  She denies vaginal bleeding, urinary retention, nausea, vomiting, CP, SOB  Objective: Vital signs in last 24 hours: Temp:  [97.2 F (36.2 C)-98.9 F (37.2 C)] 98.2 F (36.8 C) (06/07 1135) Pulse Rate:  [63-90] 80 (06/07 1135) Resp:  [10-18] 18 (06/07 1135) BP: (100-122)/(54-74) 111/54 (06/07 1135) SpO2:  [94 %-100 %] 100 % (06/07 1135)  Intake/Output from previous day: 06/06 0701 - 06/07 0700 In: 2266.7 [P.O.:720; I.V.:1493.7] Out: 2400 [Urine:2100] Intake/Output this shift: Total I/O In: 701.7 [P.O.:360; I.V.:341.7] Out: 500 [Urine:500] Recent Labs  Lab 04/18/22 0524  WBC 9.0  HGB 10.5*  HCT 32.1*  PLT 254     Recent Labs  Lab 04/18/22 0524  NA 137  K 4.5  CL 109  CO2 22  BUN 7  CREATININE 0.86  CALCIUM 8.7*  GLUCOSE 118*    EXAM: General: alert, cooperative, and appears stated age Resp: clear to auscultation bilaterally Cardio: regular rate and rhythm, S1, S2 normal, no murmur, click, rub or gallop GI: soft, appropriately tender bowel sounds normal; no masses,  no organomegaly  Incisions: clean dry intact Extremities: extremities normal, atraumatic, no cyanosis or edema and Homans sign is negative, no sign of DVT   Assessment: s/p Procedure(s): LAPAROSCOPIC ASSISTED VAGINAL HYSTERECTOMY TRANSVAGINAL TAPE (TVT) PROCEDURE CYSTOSCOPY: progressing well meeting discharge criteria  Plan: Discharge home  LOS: 0 days    Zyrus Hetland,MD 04/18/2022 12:31 PM

## 2022-04-18 NOTE — Discharge Summary (Signed)
Physician Discharge Summary  Patient ID: GEANETTE BUONOCORE MRN: [id number] DOB/AGE: [birth year] 36 y.o.  Admit date: 04/17/2022 Discharge date: 04/18/2022   Discharge Diagnoses:  Principal Problem:   Excessive and frequent menstruation Active Problems:   H/O vaginal hysterectomy   Operation: LAVH, TVT, CYSTO   Discharged Condition: good  Hospital Course: Patient taken to operating room where below procedures were performed.  There were no intraoperative or post operative complications.  Patient progressed well in the hospital and on POD#1 was meeting all discharge criteria and was discharged home.    Disposition: Discharge disposition: 01-Home or Self Care       Discharge Medications:  Allergies as of 04/18/2022   No Known Allergies      Medication List     TAKE these medications    gabapentin 100 MG capsule Commonly known as: NEURONTIN Take 1 capsule (100 mg total) by mouth 2 (two) times daily as needed (burning incision pain).   ibuprofen 600 MG tablet Commonly known as: ADVIL Take 1 tablet (600 mg total) by mouth every 6 (six) hours as needed for cramping. What changed:  how much to take how to take this when to take this reasons to take this additional instructions   nitrofurantoin (macrocrystal-monohydrate) 100 MG capsule Commonly known as: MACROBID Take 1 capsule (100 mg total) by mouth 2 (two) times daily for 7 days.   oxybutynin 10 MG 24 hr tablet Commonly known as: DITROPAN-XL Take 1 tablet (10 mg total) by mouth once as needed for up to 1 dose (can take once a daily prn bladder spasm).   oxyCODONE-acetaminophen 5-325 MG tablet Commonly known as: PERCOCET/ROXICET take 1 tablet po every 6 hours as needed for breakthrough post operative pain               Discharge Care Instructions  (From admission, onward)           Start     Ordered   04/18/22 0000  Discharge wound care:       Comments: Your incisions have skin glue which will dissolve  over time.  You may shower, no baths. Don't use lotions or savs on incision sites.   04/18/22 1301             Discharge Instructions: f/u in office as discussed  Follow-up: 1 week incision check  Final Pathology: pending   Signed: Rogelio Seen PinnCRED@ 04/18/2022, 2:46 PM

## 2022-04-18 NOTE — Plan of Care (Signed)

## 2022-04-18 NOTE — Progress Notes (Signed)
   04/18/22 1350  Departure Condition  Departure Condition Good  Mobility at [facility]  Patient/Caregiver Teaching Teach Back Method Used;Discharge instructions reviewed;Prescriptions reviewed;Follow-up care reviewed;Pain management discussed;Medications discussed;Patient/caregiver verbalized understanding  Departure Mode With significant other  Was procedural sedation performed on this patient during this visit? Yes   Patient alert and oriented x4, and VS and pain stable at discharge.

## 2022-04-19 LAB — SURGICAL PATHOLOGY
